# Patient Record
Sex: Female | Born: 1950 | Race: Black or African American | Hispanic: No | Marital: Single
Health system: Southern US, Community
[De-identification: ages and names within clinical notes are randomized; demographics above are authoritative.]

## PROBLEM LIST (undated history)

## (undated) DIAGNOSIS — I6312 Cerebral infarction due to embolism of basilar artery: Secondary | ICD-10-CM

## (undated) DIAGNOSIS — I7101 Dissection of thoracic aorta: Secondary | ICD-10-CM

## (undated) DIAGNOSIS — F1910 Other psychoactive substance abuse, uncomplicated: Secondary | ICD-10-CM

## (undated) DIAGNOSIS — I71019 Dissection of thoracic aorta, unspecified: Secondary | ICD-10-CM

## (undated) DIAGNOSIS — J9621 Acute and chronic respiratory failure with hypoxia: Secondary | ICD-10-CM

---

## 2020-09-19 ENCOUNTER — Other Ambulatory Visit (HOSPITAL_COMMUNITY): Payer: Medicare Other

## 2020-09-19 ENCOUNTER — Inpatient Hospital Stay
Admission: RE | Admit: 2020-09-19 | Discharge: 2020-10-16 | Disposition: A | Discharge status: Skilled Nursing Facility | Payer: Medicare Other | Attending: Internal Medicine | Admitting: Internal Medicine

## 2020-09-19 DIAGNOSIS — I6312 Cerebral infarction due to embolism of basilar artery: Secondary | ICD-10-CM | POA: Diagnosis present

## 2020-09-19 DIAGNOSIS — I7101 Dissection of thoracic aorta: Secondary | ICD-10-CM | POA: Diagnosis present

## 2020-09-19 DIAGNOSIS — Z431 Encounter for attention to gastrostomy: Secondary | ICD-10-CM

## 2020-09-19 DIAGNOSIS — F1910 Other psychoactive substance abuse, uncomplicated: Secondary | ICD-10-CM | POA: Diagnosis present

## 2020-09-19 DIAGNOSIS — Z4659 Encounter for fitting and adjustment of other gastrointestinal appliance and device: Secondary | ICD-10-CM

## 2020-09-19 DIAGNOSIS — R1310 Dysphagia, unspecified: Secondary | ICD-10-CM

## 2020-09-19 DIAGNOSIS — J9690 Respiratory failure, unspecified, unspecified whether with hypoxia or hypercapnia: Secondary | ICD-10-CM

## 2020-09-19 DIAGNOSIS — I71019 Dissection of thoracic aorta, unspecified: Secondary | ICD-10-CM | POA: Diagnosis present

## 2020-09-19 DIAGNOSIS — J9621 Acute and chronic respiratory failure with hypoxia: Secondary | ICD-10-CM | POA: Diagnosis present

## 2020-09-19 HISTORY — DX: Cerebral infarction due to embolism of basilar artery: I63.12

## 2020-09-19 HISTORY — DX: Dissection of thoracic aorta, unspecified: I71.019

## 2020-09-19 HISTORY — DX: Other psychoactive substance abuse, uncomplicated: F19.10

## 2020-09-19 HISTORY — DX: Dissection of thoracic aorta: I71.01

## 2020-09-19 HISTORY — DX: Acute and chronic respiratory failure with hypoxia: J96.21

## 2020-09-19 MED ORDER — IOHEXOL 350 MG/ML SOLN
50.0000 mL | Freq: Once | INTRAVENOUS | Status: AC | PRN
  Administered 2020-09-19: 50 mL

## 2020-09-20 ENCOUNTER — Other Ambulatory Visit (HOSPITAL_COMMUNITY): Payer: Medicare Other

## 2020-09-20 LAB — COMPREHENSIVE METABOLIC PANEL
ALT: 46 U/L — ABNORMAL HIGH (ref 0–44)
AST: 39 U/L (ref 15–41)
Albumin: 1.9 g/dL — ABNORMAL LOW (ref 3.5–5.0)
Alkaline Phosphatase: 58 U/L (ref 38–126)
Anion gap: 6 (ref 5–15)
BUN: 60 mg/dL — ABNORMAL HIGH (ref 8–23)
CO2: 27 mmol/L (ref 22–32)
Calcium: 8.8 mg/dL — ABNORMAL LOW (ref 8.9–10.3)
Chloride: 112 mmol/L — ABNORMAL HIGH (ref 98–111)
Creatinine, Ser: 2.03 mg/dL — ABNORMAL HIGH (ref 0.44–1.00)
GFR, Estimated: 26 mL/min — ABNORMAL LOW (ref >60)
Glucose, Bld: 126 mg/dL — ABNORMAL HIGH (ref 70–99)
Potassium: 4.9 mmol/L (ref 3.5–5.1)
Sodium: 145 mmol/L (ref 135–145)
Total Bilirubin: 0.3 mg/dL (ref 0.3–1.2)
Total Protein: 5.9 g/dL — ABNORMAL LOW (ref 6.5–8.1)

## 2020-09-20 LAB — CBC WITH DIFFERENTIAL/PLATELET
Abs Immature Granulocytes: 0.07 10*3/uL (ref 0.00–0.07)
Basophils Absolute: 0 10*3/uL (ref 0.0–0.1)
Basophils Relative: 0 %
Eosinophils Absolute: 0.3 10*3/uL (ref 0.0–0.5)
Eosinophils Relative: 2 %
HCT: 29.1 % — ABNORMAL LOW (ref 36.0–46.0)
Hemoglobin: 9.6 g/dL — ABNORMAL LOW (ref 12.0–15.0)
Immature Granulocytes: 1 %
Lymphocytes Relative: 9 %
Lymphs Abs: 1 10*3/uL (ref 0.7–4.0)
MCH: 30.2 pg (ref 26.0–34.0)
MCHC: 33 g/dL (ref 30.0–36.0)
MCV: 91.5 fL (ref 80.0–100.0)
Monocytes Absolute: 1.1 10*3/uL — ABNORMAL HIGH (ref 0.1–1.0)
Monocytes Relative: 9 %
Neutro Abs: 9.5 10*3/uL — ABNORMAL HIGH (ref 1.7–7.7)
Neutrophils Relative %: 79 %
Platelets: 375 10*3/uL (ref 150–400)
RBC: 3.18 MIL/uL — ABNORMAL LOW (ref 3.87–5.11)
RDW: 14.3 % (ref 11.5–15.5)
WBC: 12 10*3/uL — ABNORMAL HIGH (ref 4.0–10.5)
nRBC: 0 % (ref 0.0–0.2)

## 2020-09-20 LAB — URINALYSIS, ROUTINE W REFLEX MICROSCOPIC
Bilirubin Urine: NEGATIVE
Glucose, UA: NEGATIVE mg/dL
Hgb urine dipstick: NEGATIVE
Ketones, ur: NEGATIVE mg/dL
Nitrite: NEGATIVE
Protein, ur: 30 mg/dL — AB
Specific Gravity, Urine: 1.012 (ref 1.005–1.030)
pH: 7 (ref 5.0–8.0)

## 2020-09-20 LAB — MAGNESIUM: Magnesium: 2.5 mg/dL — ABNORMAL HIGH (ref 1.7–2.4)

## 2020-09-20 LAB — BLOOD GAS, ARTERIAL
Acid-Base Excess: 1.7 mmol/L (ref 0.0–2.0)
Bicarbonate: 25 mmol/L (ref 20.0–28.0)
FIO2: 28
O2 Saturation: 94.9 %
Patient temperature: 36.3
pCO2 arterial: 32.9 mmHg (ref 32.0–48.0)
pH, Arterial: 7.489 — ABNORMAL HIGH (ref 7.350–7.450)
pO2, Arterial: 71 mmHg — ABNORMAL LOW (ref 83.0–108.0)

## 2020-09-20 LAB — PHOSPHORUS: Phosphorus: 4.9 mg/dL — ABNORMAL HIGH (ref 2.5–4.6)

## 2020-09-20 LAB — C DIFFICILE QUICK SCREEN W PCR REFLEX
C Diff antigen: NEGATIVE
C Diff interpretation: NOT DETECTED
C Diff toxin: NEGATIVE

## 2020-09-20 LAB — PROTIME-INR
INR: 1.3 — ABNORMAL HIGH (ref 0.8–1.2)
Prothrombin Time: 15.4 seconds — ABNORMAL HIGH (ref 11.4–15.2)

## 2020-09-21 LAB — URINALYSIS, ROUTINE W REFLEX MICROSCOPIC
Bilirubin Urine: NEGATIVE
Glucose, UA: NEGATIVE mg/dL
Hgb urine dipstick: NEGATIVE
Ketones, ur: NEGATIVE mg/dL
Nitrite: NEGATIVE
Protein, ur: 30 mg/dL — AB
Specific Gravity, Urine: 1.012 (ref 1.005–1.030)
pH: 7 (ref 5.0–8.0)

## 2020-09-22 LAB — URINE CULTURE
Culture: NO GROWTH
Culture: NO GROWTH

## 2020-09-23 LAB — BASIC METABOLIC PANEL
Anion gap: 6 (ref 5–15)
BUN: 55 mg/dL — ABNORMAL HIGH (ref 8–23)
CO2: 25 mmol/L (ref 22–32)
Calcium: 8.7 mg/dL — ABNORMAL LOW (ref 8.9–10.3)
Chloride: 112 mmol/L — ABNORMAL HIGH (ref 98–111)
Creatinine, Ser: 2.18 mg/dL — ABNORMAL HIGH (ref 0.44–1.00)
GFR, Estimated: 24 mL/min — ABNORMAL LOW (ref >60)
Glucose, Bld: 145 mg/dL — ABNORMAL HIGH (ref 70–99)
Potassium: 4.6 mmol/L (ref 3.5–5.1)
Sodium: 143 mmol/L (ref 135–145)

## 2020-09-23 LAB — CBC
HCT: 26 % — ABNORMAL LOW (ref 36.0–46.0)
Hemoglobin: 8.3 g/dL — ABNORMAL LOW (ref 12.0–15.0)
MCH: 29.3 pg (ref 26.0–34.0)
MCHC: 31.9 g/dL (ref 30.0–36.0)
MCV: 91.9 fL (ref 80.0–100.0)
Platelets: 379 10*3/uL (ref 150–400)
RBC: 2.83 MIL/uL — ABNORMAL LOW (ref 3.87–5.11)
RDW: 14.4 % (ref 11.5–15.5)
WBC: 12.2 10*3/uL — ABNORMAL HIGH (ref 4.0–10.5)
nRBC: 0 % (ref 0.0–0.2)

## 2020-09-23 LAB — CULTURE, RESPIRATORY W GRAM STAIN: Culture: NORMAL

## 2020-09-23 LAB — MAGNESIUM: Magnesium: 2.3 mg/dL (ref 1.7–2.4)

## 2020-09-24 DIAGNOSIS — J9621 Acute and chronic respiratory failure with hypoxia: Secondary | ICD-10-CM | POA: Diagnosis not present

## 2020-09-24 DIAGNOSIS — I6312 Cerebral infarction due to embolism of basilar artery: Secondary | ICD-10-CM

## 2020-09-24 DIAGNOSIS — F1910 Other psychoactive substance abuse, uncomplicated: Secondary | ICD-10-CM | POA: Diagnosis not present

## 2020-09-24 DIAGNOSIS — I7101 Dissection of thoracic aorta: Secondary | ICD-10-CM | POA: Diagnosis not present

## 2020-09-24 NOTE — Consult Note (Signed)
Pulmonary Critical Care Medicine [facility] [place]  PULMONARY SERVICE  Date of Service: 09/24/2020  PULMONARY CRITICAL CARE CONSULT   [name]  HKV:[id number]  DOB: 1950/07/24   DOA: 09/19/2020  Referring Physician: Carron Curie, MD  HPI: Misty Lawson is a 70 y.o. female seen for follow up of Acute on Chronic Respiratory Failure.  Patient has multiple medical problems basically presented with a history of glaucoma hypertension and cocaine abuse presented to the hospital because of chest pain.  Patient was found to have a type a dissection at the level of the root taken to the OR for emergent surgery and replacement of the descending aortic hemiarch.  The patient had transferred to the ICU postoperatively EF at that time was noted to be 40 to 45%.  The patient subsequently had an echocardiogram which showed an improvement to EF of 60 to 65%.  She is now transferred to our facility for further management and treatment.  At the time of evaluation patient was basically off the ventilator.  Review of Systems:  ROS performed and is unremarkable other than noted above.  Past Medical History:  Diagnosis Date  . Food impaction of esophagus 12/23/2012  . Glaucoma 12/23/2012  . Hypertension 12/23/2012   Past Surgical History:  Procedure Laterality Date  . ESOPHAGOSCOPY N/A 12/23/2012  Procedure: ESOPHAGOSCOPY RIGID; Surgeon: Barnie Alderman, MD; Location: [facility] OUTPATIENT OR; Service: ENT; Laterality: N/A; removal food impaction   No family history on file.  Social History  Positive for substance abuse including cocaine   Medications: Reviewed on Rounds  Physical Exam:  Vitals: Temperature 97.2 pulse 60 respiratory rate is 22 blood pressure is 123/64 saturations 97%  Ventilator Settings on pressure support FiO2 28% pressure 12/5  . General: Comfortable at this time . Eyes: Grossly normal lids, irises & conjunctiva . ENT: grossly tongue is normal . Neck: no obvious  mass . Cardiovascular: S1-S2 normal no gallop or rub . Respiratory: Scattered rhonchi expansion is equal . Abdomen: Soft and nontender . Skin: no rash seen on limited exam . Musculoskeletal: not rigid . Psychiatric:unable to assess . Neurologic: no seizure no involuntary movements         Labs on Admission:  Basic Metabolic Panel: Recent Labs  Lab 09/20/20 0535 09/23/20 0227  NA 145 143  K 4.9 4.6  CL 112* 112*  CO2 27 25  GLUCOSE 126* 145*  BUN 60* 55*  CREATININE 2.03* 2.18*  CALCIUM 8.8* 8.7*  MG 2.5* 2.3  PHOS 4.9*  --     Recent Labs  Lab 09/20/20 1500  PHART 7.489*  PCO2ART 32.9  PO2ART 71.0*  HCO3 25.0  O2SAT 94.9    Liver Function Tests: Recent Labs  Lab 09/20/20 0535  AST 39  ALT 46*  ALKPHOS 58  BILITOT 0.3  PROT 5.9*  ALBUMIN 1.9*   No results for input(s): LIPASE, AMYLASE in the last 168 hours. No results for input(s): AMMONIA in the last 168 hours.  CBC: Recent Labs  Lab 09/20/20 0535 09/23/20 0227  WBC 12.0* 12.2*  NEUTROABS 9.5*  --   HGB 9.6* 8.3*  HCT 29.1* 26.0*  MCV 91.5 91.9  PLT 375 379    Cardiac Enzymes: No results for input(s): CKTOTAL, CKMB, CKMBINDEX, TROPONINI in the last 168 hours.  BNP (last 3 results) No results for input(s): BNP in the last 8760 hours.  ProBNP (last 3 results) No results for input(s): PROBNP in the last 8760 hours.   Radiological Exams  on Admission: No results found.  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 Acute on chronic respiratory failure hypoxia we will continue with pressure support right now is on 28% FiO2 patient will be attempting to wean on T collar 16 hours as tolerated also today 2. Polysubstance abuse needs to be monitored for ongoing withdrawal issues. 3. Acute stroke patient had MRI which  revealed acute stroke we will continue to monitor closely. 4. Type a aortic dissection status post repair  I have personally seen and evaluated the patient, evaluated laboratory and imaging results, formulated the assessment and plan and placed orders. The Patient requires high complexity decision making with multiple systems involvement.  Case was discussed on Rounds with the Respiratory Therapy Director and the Respiratory staff Time Spent  Yevonne Pax, MD [facility] Pulmonary Critical Care Medicine Sleep Medicine

## 2020-09-25 ENCOUNTER — Encounter: Payer: Self-pay | Admitting: Internal Medicine

## 2020-09-25 DIAGNOSIS — I6312 Cerebral infarction due to embolism of basilar artery: Secondary | ICD-10-CM | POA: Diagnosis present

## 2020-09-25 DIAGNOSIS — F1910 Other psychoactive substance abuse, uncomplicated: Secondary | ICD-10-CM | POA: Diagnosis not present

## 2020-09-25 DIAGNOSIS — J9621 Acute and chronic respiratory failure with hypoxia: Secondary | ICD-10-CM | POA: Diagnosis present

## 2020-09-25 DIAGNOSIS — I7101 Dissection of thoracic aorta: Secondary | ICD-10-CM | POA: Diagnosis present

## 2020-09-25 DIAGNOSIS — I71019 Dissection of thoracic aorta, unspecified: Secondary | ICD-10-CM | POA: Diagnosis present

## 2020-09-25 LAB — PHOSPHORUS: Phosphorus: 5.2 mg/dL — ABNORMAL HIGH (ref 2.5–4.6)

## 2020-09-25 LAB — BASIC METABOLIC PANEL
Anion gap: 7 (ref 5–15)
BUN: 51 mg/dL — ABNORMAL HIGH (ref 8–23)
CO2: 26 mmol/L (ref 22–32)
Calcium: 9 mg/dL (ref 8.9–10.3)
Chloride: 107 mmol/L (ref 98–111)
Creatinine, Ser: 1.93 mg/dL — ABNORMAL HIGH (ref 0.44–1.00)
GFR, Estimated: 28 mL/min — ABNORMAL LOW (ref >60)
Glucose, Bld: 136 mg/dL — ABNORMAL HIGH (ref 70–99)
Potassium: 5 mmol/L (ref 3.5–5.1)
Sodium: 140 mmol/L (ref 135–145)

## 2020-09-25 LAB — CBC
HCT: 26.1 % — ABNORMAL LOW (ref 36.0–46.0)
Hemoglobin: 8.3 g/dL — ABNORMAL LOW (ref 12.0–15.0)
MCH: 29.5 pg (ref 26.0–34.0)
MCHC: 31.8 g/dL (ref 30.0–36.0)
MCV: 92.9 fL (ref 80.0–100.0)
Platelets: 423 10*3/uL — ABNORMAL HIGH (ref 150–400)
RBC: 2.81 MIL/uL — ABNORMAL LOW (ref 3.87–5.11)
RDW: 14.3 % (ref 11.5–15.5)
WBC: 12 10*3/uL — ABNORMAL HIGH (ref 4.0–10.5)
nRBC: 0 % (ref 0.0–0.2)

## 2020-09-25 LAB — MAGNESIUM: Magnesium: 2.1 mg/dL (ref 1.7–2.4)

## 2020-09-25 NOTE — Progress Notes (Signed)
Pulmonary Critical Care Medicine [facility] [place]   PULMONARY CRITICAL CARE SERVICE  PROGRESS NOTE     Misty Lawson  PJA:[id number]  DOB: 1951-01-23   DOA: 09/19/2020  Referring Physician: Carron Curie, MD  HPI: Misty Lawson is a 70 y.o. female seen for follow up of Acute on Chronic Respiratory Failure.  At this time patient is on T collar has been on 20% FiO2 the goal is for 20 hours  Medications: Reviewed on Rounds  Physical Exam:  Vitals: Temperature is 97.3 pulse 68 respiratory rate 30 blood pressure is 149/87 saturations 97%  Ventilator Settings on T collar with an FiO2 of 28%  . General: Comfortable at this time . Eyes: Grossly normal lids, irises & conjunctiva . ENT: grossly tongue is normal . Neck: no obvious mass . Cardiovascular: S1 S2 normal no gallop . Respiratory: Scattered rhonchi expansion is equal . Abdomen: soft . Skin: no rash seen on limited exam . Musculoskeletal: not rigid . Psychiatric:unable to assess . Neurologic: no seizure no involuntary movements         Lab Data:   Basic Metabolic Panel: Recent Labs  Lab 09/20/20 0535 09/23/20 0227 09/25/20 0551  NA 145 143 140  K 4.9 4.6 5.0  CL 112* 112* 107  CO2 27 25 26   GLUCOSE 126* 145* 136*  BUN 60* 55* 51*  CREATININE 2.03* 2.18* 1.93*  CALCIUM 8.8* 8.7* 9.0  MG 2.5* 2.3 2.1  PHOS 4.9*  --  5.2*    ABG: Recent Labs  Lab 09/20/20 1500  PHART 7.489*  PCO2ART 32.9  PO2ART 71.0*  HCO3 25.0  O2SAT 94.9    Liver Function Tests: Recent Labs  Lab 09/20/20 0535  AST 39  ALT 46*  ALKPHOS 58  BILITOT 0.3  PROT 5.9*  ALBUMIN 1.9*   No results for input(s): LIPASE, AMYLASE in the last 168 hours. No results for input(s): AMMONIA in the last 168 hours.  CBC: Recent Labs  Lab 09/20/20 0535 09/23/20 0227 09/25/20 0551  WBC 12.0* 12.2* 12.0*  NEUTROABS 9.5*  --   --   HGB 9.6* 8.3* 8.3*  HCT 29.1* 26.0* 26.1*  MCV 91.5 91.9 92.9  PLT 375 379 423*    Cardiac  Enzymes: No results for input(s): CKTOTAL, CKMB, CKMBINDEX, TROPONINI in the last 168 hours.  BNP (last 3 results) No results for input(s): BNP in the last 8760 hours.  ProBNP (last 3 results) No results for input(s): PROBNP in the last 8760 hours.  Radiological Exams: No results found.  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 Acute on chronic respiratory failure hypoxia we will continue with T collar trials patient currently is on 20% FiO2 goal is for 20 hours 2. Polysubstance abuse no sign of active withdrawal but we are going to continue to monitor closely. 3. Acute stroke supportive care therapy as tolerated 4. Thoracic aortic aneurysm dissection status post repair   I have personally seen and evaluated the patient, evaluated laboratory and imaging results, formulated the assessment and plan and placed orders. The Patient requires high complexity decision making with multiple systems involvement.  Rounds were done with the Respiratory Therapy Director and Staff therapists and discussed with nursing staff also.  11/25/20, MD [facility] Pulmonary Critical Care Medicine Sleep Medicine

## 2020-09-26 DIAGNOSIS — I6312 Cerebral infarction due to embolism of basilar artery: Secondary | ICD-10-CM | POA: Diagnosis not present

## 2020-09-26 DIAGNOSIS — J9621 Acute and chronic respiratory failure with hypoxia: Secondary | ICD-10-CM | POA: Diagnosis not present

## 2020-09-26 DIAGNOSIS — I7101 Dissection of thoracic aorta: Secondary | ICD-10-CM | POA: Diagnosis not present

## 2020-09-26 DIAGNOSIS — F1910 Other psychoactive substance abuse, uncomplicated: Secondary | ICD-10-CM | POA: Diagnosis not present

## 2020-09-26 LAB — MAGNESIUM: Magnesium: 2 mg/dL (ref 1.7–2.4)

## 2020-09-26 LAB — PHOSPHORUS: Phosphorus: 4.7 mg/dL — ABNORMAL HIGH (ref 2.5–4.6)

## 2020-09-26 LAB — BASIC METABOLIC PANEL
Anion gap: 7 (ref 5–15)
BUN: 66 mg/dL — ABNORMAL HIGH (ref 8–23)
CO2: 24 mmol/L (ref 22–32)
Calcium: 8.6 mg/dL — ABNORMAL LOW (ref 8.9–10.3)
Chloride: 108 mmol/L (ref 98–111)
Creatinine, Ser: 1.89 mg/dL — ABNORMAL HIGH (ref 0.44–1.00)
GFR, Estimated: 29 mL/min — ABNORMAL LOW (ref >60)
Glucose, Bld: 131 mg/dL — ABNORMAL HIGH (ref 70–99)
Potassium: 5.2 mmol/L — ABNORMAL HIGH (ref 3.5–5.1)
Sodium: 139 mmol/L (ref 135–145)

## 2020-09-26 NOTE — Progress Notes (Signed)
Pulmonary Critical Care Medicine [facility] [place]   PULMONARY CRITICAL CARE SERVICE  PROGRESS NOTE     Misty Lawson  BSW:[id number]  DOB: March 29, 1951   DOA: 09/19/2020  Referring Physician: Carron Curie, MD  HPI: Misty Lawson is a 70 y.o. female seen for follow up of Acute on Chronic Respiratory Failure.  Patient remains resting comfortably was able to do 16 hours of T collar today should be able to do about 20  Medications: Reviewed on Rounds  Physical Exam:  Vitals: Temperature is 97.8 pulse 74 respiratory rate is 20 blood pressure is 157/102 saturations 100%  Ventilator Settings on T collar  . General: Comfortable at this time . Eyes: Grossly normal lids, irises & conjunctiva . ENT: grossly tongue is normal . Neck: no obvious mass . Cardiovascular: S1 S2 normal no gallop . Respiratory: No rhonchi no rales are noted at this time . Abdomen: soft . Skin: no rash seen on limited exam . Musculoskeletal: not rigid . Psychiatric:unable to assess . Neurologic: no seizure no involuntary movements         Lab Data:   Basic Metabolic Panel: Recent Labs  Lab 09/20/20 0535 09/23/20 0227 09/25/20 0551 09/26/20 0700  NA 145 143 140 139  K 4.9 4.6 5.0 5.2*  CL 112* 112* 107 108  CO2 27 25 26 24   GLUCOSE 126* 145* 136* 131*  BUN 60* 55* 51* 66*  CREATININE 2.03* 2.18* 1.93* 1.89*  CALCIUM 8.8* 8.7* 9.0 8.6*  MG 2.5* 2.3 2.1 2.0  PHOS 4.9*  --  5.2* 4.7*    ABG: Recent Labs  Lab 09/20/20 1500  PHART 7.489*  PCO2ART 32.9  PO2ART 71.0*  HCO3 25.0  O2SAT 94.9    Liver Function Tests: Recent Labs  Lab 09/20/20 0535  AST 39  ALT 46*  ALKPHOS 58  BILITOT 0.3  PROT 5.9*  ALBUMIN 1.9*   No results for input(s): LIPASE, AMYLASE in the last 168 hours. No results for input(s): AMMONIA in the last 168 hours.  CBC: Recent Labs  Lab 09/20/20 0535 09/23/20 0227 09/25/20 0551  WBC 12.0* 12.2* 12.0*  NEUTROABS 9.5*  --   --   HGB 9.6* 8.3* 8.3*   HCT 29.1* 26.0* 26.1*  MCV 91.5 91.9 92.9  PLT 375 379 423*    Cardiac Enzymes: No results for input(s): CKTOTAL, CKMB, CKMBINDEX, TROPONINI in the last 168 hours.  BNP (last 3 results) No results for input(s): BNP in the last 8760 hours.  ProBNP (last 3 results) No results for input(s): PROBNP in the last 8760 hours.  Radiological Exams: No results found.  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 Acute on chronic respiratory failure with hypoxia plan is to continue with the weekly T collar 20 hours 2. Polysubstance abuse no change we will continue to follow 3. Acute stroke no change continue present therapy 4. Acute thoracic get aortic aneurysm supportive care no active bleeding   I have personally seen and evaluated the patient, evaluated laboratory and imaging results, formulated the assessment and plan and placed orders. The Patient requires high complexity decision making with multiple systems involvement.  Rounds were done with the Respiratory Therapy Director and Staff therapists and discussed with nursing staff also.  11/25/20, MD [facility] Pulmonary Critical Care Medicine Sleep  Medicine

## 2020-09-27 DIAGNOSIS — I7101 Dissection of thoracic aorta: Secondary | ICD-10-CM | POA: Diagnosis not present

## 2020-09-27 DIAGNOSIS — J9621 Acute and chronic respiratory failure with hypoxia: Secondary | ICD-10-CM | POA: Diagnosis not present

## 2020-09-27 DIAGNOSIS — I6312 Cerebral infarction due to embolism of basilar artery: Secondary | ICD-10-CM | POA: Diagnosis not present

## 2020-09-27 DIAGNOSIS — F1910 Other psychoactive substance abuse, uncomplicated: Secondary | ICD-10-CM | POA: Diagnosis not present

## 2020-09-27 LAB — RENAL FUNCTION PANEL
Albumin: 1.9 g/dL — ABNORMAL LOW (ref 3.5–5.0)
Anion gap: 8 (ref 5–15)
BUN: 55 mg/dL — ABNORMAL HIGH (ref 8–23)
CO2: 25 mmol/L (ref 22–32)
Calcium: 8.9 mg/dL (ref 8.9–10.3)
Chloride: 108 mmol/L (ref 98–111)
Creatinine, Ser: 1.93 mg/dL — ABNORMAL HIGH (ref 0.44–1.00)
GFR, Estimated: 28 mL/min — ABNORMAL LOW (ref >60)
Glucose, Bld: 108 mg/dL — ABNORMAL HIGH (ref 70–99)
Phosphorus: 5.4 mg/dL — ABNORMAL HIGH (ref 2.5–4.6)
Potassium: 4.3 mmol/L (ref 3.5–5.1)
Sodium: 141 mmol/L (ref 135–145)

## 2020-09-27 LAB — CBC
HCT: 25.2 % — ABNORMAL LOW (ref 36.0–46.0)
Hemoglobin: 8.4 g/dL — ABNORMAL LOW (ref 12.0–15.0)
MCH: 30 pg (ref 26.0–34.0)
MCHC: 33.3 g/dL (ref 30.0–36.0)
MCV: 90 fL (ref 80.0–100.0)
Platelets: 429 10*3/uL — ABNORMAL HIGH (ref 150–400)
RBC: 2.8 MIL/uL — ABNORMAL LOW (ref 3.87–5.11)
RDW: 14.2 % (ref 11.5–15.5)
WBC: 9 10*3/uL (ref 4.0–10.5)
nRBC: 0 % (ref 0.0–0.2)

## 2020-09-27 LAB — BLOOD GAS, ARTERIAL
Acid-Base Excess: 1.4 mmol/L (ref 0.0–2.0)
Bicarbonate: 25.1 mmol/L (ref 20.0–28.0)
FIO2: 28
O2 Saturation: 94 %
Patient temperature: 36.7
pCO2 arterial: 37.1 mmHg (ref 32.0–48.0)
pH, Arterial: 7.444 (ref 7.350–7.450)
pO2, Arterial: 70.9 mmHg — ABNORMAL LOW (ref 83.0–108.0)

## 2020-09-27 LAB — MAGNESIUM: Magnesium: 2.2 mg/dL (ref 1.7–2.4)

## 2020-09-27 NOTE — Progress Notes (Signed)
Pulmonary Critical Care Medicine [facility] [place]   PULMONARY CRITICAL CARE SERVICE  PROGRESS NOTE     Misty Lawson  CHY:[id number]  DOB: 11-02-1950   DOA: 09/19/2020  Referring Physician: Carron Curie, MD  HPI: Misty Lawson is a 70 y.o. female seen for follow up of Acute on Chronic Respiratory Failure.  Patient currently is on T collar comfortable without distress has been on 28% FiO2  Medications: Reviewed on Rounds  Physical Exam:  Vitals: Temperature is 98.1 pulse 81 respiratory 24 blood pressure is 146/86 saturations 97%  Ventilator Settings: T collar with FiO2 28%  . General: Comfortable at this time . Eyes: Grossly normal lids, irises & conjunctiva . ENT: grossly tongue is normal . Neck: no obvious mass . Cardiovascular: S1 S2 normal no gallop . Respiratory: No rhonchi or rales are noted at this time . Abdomen: soft . Skin: no rash seen on limited exam . Musculoskeletal: not rigid . Psychiatric:unable to assess . Neurologic: no seizure no involuntary movements         Lab Data:   Basic Metabolic Panel: Recent Labs  Lab 09/23/20 0227 09/25/20 0551 09/26/20 0700 09/27/20 0441  NA 143 140 139 141  K 4.6 5.0 5.2* 4.3  CL 112* 107 108 108  CO2 25 26 24 25   GLUCOSE 145* 136* 131* 108*  BUN 55* 51* 66* 55*  CREATININE 2.18* 1.93* 1.89* 1.93*  CALCIUM 8.7* 9.0 8.6* 8.9  MG 2.3 2.1 2.0 2.2  PHOS  --  5.2* 4.7* 5.4*    ABG: Recent Labs  Lab 09/20/20 1500 09/27/20 1215  PHART 7.489* 7.444  PCO2ART 32.9 37.1  PO2ART 71.0* 70.9*  HCO3 25.0 25.1  O2SAT 94.9 94.0    Liver Function Tests: Recent Labs  Lab 09/27/20 0441  ALBUMIN 1.9*   No results for input(s): LIPASE, AMYLASE in the last 168 hours. No results for input(s): AMMONIA in the last 168 hours.  CBC: Recent Labs  Lab 09/23/20 0227 09/25/20 0551 09/27/20 0441  WBC 12.2* 12.0* 9.0  HGB 8.3* 8.3* 8.4*  HCT 26.0* 26.1* 25.2*  MCV 91.9 92.9 90.0  PLT 379 423* 429*     Cardiac Enzymes: No results for input(s): CKTOTAL, CKMB, CKMBINDEX, TROPONINI in the last 168 hours.  BNP (last 3 results) No results for input(s): BNP in the last 8760 hours.  ProBNP (last 3 results) No results for input(s): PROBNP in the last 8760 hours.  Radiological Exams: No results found.  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 Acute on chronic respiratory failure hypoxia plan is to continue T collar titrate oxygen continue pulmonary toilet. 2. Polysubstance abuse no change we will continue to follow along. 3. Acute stroke at baseline 4. Thoracic aortic dissection supportive care   I have personally seen and evaluated the patient, evaluated laboratory and imaging results, formulated the assessment and plan and placed orders. The Patient requires high complexity decision making with multiple systems involvement.  Rounds were done with the Respiratory Therapy Director and Staff therapists and discussed with nursing staff also.  09/29/20, MD [facility] Pulmonary Critical Care Medicine Sleep Medicine

## 2020-09-28 ENCOUNTER — Other Ambulatory Visit (HOSPITAL_COMMUNITY): Payer: Medicare Other

## 2020-09-28 DIAGNOSIS — I7101 Dissection of thoracic aorta: Secondary | ICD-10-CM | POA: Diagnosis not present

## 2020-09-28 DIAGNOSIS — I6312 Cerebral infarction due to embolism of basilar artery: Secondary | ICD-10-CM | POA: Diagnosis not present

## 2020-09-28 DIAGNOSIS — F1910 Other psychoactive substance abuse, uncomplicated: Secondary | ICD-10-CM | POA: Diagnosis not present

## 2020-09-28 DIAGNOSIS — J9621 Acute and chronic respiratory failure with hypoxia: Secondary | ICD-10-CM | POA: Diagnosis not present

## 2020-09-28 LAB — CULTURE, RESPIRATORY W GRAM STAIN: Culture: NORMAL

## 2020-09-28 NOTE — Progress Notes (Signed)
Pulmonary Critical Care Medicine [facility] [place]   PULMONARY CRITICAL CARE SERVICE  PROGRESS NOTE     Misty Lawson  VQM:[id number]  DOB: [birth year]   DOA: 09/19/2020  Referring Physician: Carron Curie, MD  HPI: Misty Lawson is a 70 y.o. female seen for follow up of Acute on Chronic Respiratory Failure.  Patient remains on T collar has been on 35% FiO2 with goal of 48 hours  Medications: Reviewed on Rounds  Physical Exam:  Vitals: Temperature is 97.8 pulse 75 respiratory 29 blood pressure is 138/86 saturations 99%  Ventilator Settings on T collar with an FiO2 of 35%  . General: Comfortable at this time . Eyes: Grossly normal lids, irises & conjunctiva . ENT: grossly tongue is normal . Neck: no obvious mass . Cardiovascular: S1 S2 normal no gallop . Respiratory: No rhonchi very coarse breath sounds . Abdomen: soft . Skin: no rash seen on limited exam . Musculoskeletal: not rigid . Psychiatric:unable to assess . Neurologic: no seizure no involuntary movements         Lab Data:   Basic Metabolic Panel: Recent Labs  Lab 09/23/20 0227 09/25/20 0551 09/26/20 0700 09/27/20 0441  NA 143 140 139 141  K 4.6 5.0 5.2* 4.3  CL 112* 107 108 108  CO2 25 26 24 25   GLUCOSE 145* 136* 131* 108*  BUN 55* 51* 66* 55*  CREATININE 2.18* 1.93* 1.89* 1.93*  CALCIUM 8.7* 9.0 8.6* 8.9  MG 2.3 2.1 2.0 2.2  PHOS  --  5.2* 4.7* 5.4*    ABG: Recent Labs  Lab 09/27/20 1215  PHART 7.444  PCO2ART 37.1  PO2ART 70.9*  HCO3 25.1  O2SAT 94.0    Liver Function Tests: Recent Labs  Lab 09/27/20 0441  ALBUMIN 1.9*   No results for input(s): LIPASE, AMYLASE in the last 168 hours. No results for input(s): AMMONIA in the last 168 hours.  CBC: Recent Labs  Lab 09/23/20 0227 09/25/20 0551 09/27/20 0441  WBC 12.2* 12.0* 9.0  HGB 8.3* 8.3* 8.4*  HCT 26.0* 26.1* 25.2*  MCV 91.9 92.9 90.0  PLT 379 423* 429*    Cardiac Enzymes: No results for input(s): CKTOTAL,  CKMB, CKMBINDEX, TROPONINI in the last 168 hours.  BNP (last 3 results) No results for input(s): BNP in the last 8760 hours.  ProBNP (last 3 results) No results for input(s): PROBNP in the last 8760 hours.  Radiological Exams: No results found.  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 Acute on chronic respiratory failure hypoxia continue OT collar trials titrate oxygen as tolerated continue pulmonary toilet. 2. Acute stroke no change continue supportive care 3. Thoracic aortic aneurysm supportive care 4. Polysubstance abuse no active withdrawal right now   I have personally seen and evaluated the patient, evaluated laboratory and imaging results, formulated the assessment and plan and placed orders. The Patient requires high complexity decision making with multiple systems involvement.  Rounds were done with the Respiratory Therapy Director and Staff therapists and discussed with nursing staff also.  09/29/20, MD [facility] Pulmonary Critical Care Medicine Sleep Medicine

## 2020-09-29 LAB — RENAL FUNCTION PANEL
Albumin: 1.9 g/dL — ABNORMAL LOW (ref 3.5–5.0)
Anion gap: 10 (ref 5–15)
BUN: 61 mg/dL — ABNORMAL HIGH (ref 8–23)
CO2: 25 mmol/L (ref 22–32)
Calcium: 9.2 mg/dL (ref 8.9–10.3)
Chloride: 107 mmol/L (ref 98–111)
Creatinine, Ser: 1.69 mg/dL — ABNORMAL HIGH (ref 0.44–1.00)
GFR, Estimated: 33 mL/min — ABNORMAL LOW (ref >60)
Glucose, Bld: 106 mg/dL — ABNORMAL HIGH (ref 70–99)
Phosphorus: 5.1 mg/dL — ABNORMAL HIGH (ref 2.5–4.6)
Potassium: 4.4 mmol/L (ref 3.5–5.1)
Sodium: 142 mmol/L (ref 135–145)

## 2020-09-29 LAB — CBC
HCT: 24.4 % — ABNORMAL LOW (ref 36.0–46.0)
Hemoglobin: 7.9 g/dL — ABNORMAL LOW (ref 12.0–15.0)
MCH: 29 pg (ref 26.0–34.0)
MCHC: 32.4 g/dL (ref 30.0–36.0)
MCV: 89.7 fL (ref 80.0–100.0)
Platelets: 428 10*3/uL — ABNORMAL HIGH (ref 150–400)
RBC: 2.72 MIL/uL — ABNORMAL LOW (ref 3.87–5.11)
RDW: 14.1 % (ref 11.5–15.5)
WBC: 8.8 10*3/uL (ref 4.0–10.5)
nRBC: 0 % (ref 0.0–0.2)

## 2020-09-29 LAB — MAGNESIUM: Magnesium: 2.1 mg/dL (ref 1.7–2.4)

## 2020-09-30 ENCOUNTER — Other Ambulatory Visit (HOSPITAL_COMMUNITY): Payer: Medicare Other

## 2020-09-30 LAB — SARS CORONAVIRUS 2 (TAT 6-24 HRS): SARS Coronavirus 2: NEGATIVE

## 2020-10-01 DIAGNOSIS — J9621 Acute and chronic respiratory failure with hypoxia: Secondary | ICD-10-CM | POA: Diagnosis not present

## 2020-10-01 DIAGNOSIS — I7101 Dissection of thoracic aorta: Secondary | ICD-10-CM | POA: Diagnosis not present

## 2020-10-01 DIAGNOSIS — I6312 Cerebral infarction due to embolism of basilar artery: Secondary | ICD-10-CM | POA: Diagnosis not present

## 2020-10-01 DIAGNOSIS — F1910 Other psychoactive substance abuse, uncomplicated: Secondary | ICD-10-CM | POA: Diagnosis not present

## 2020-10-01 NOTE — Consult Note (Signed)
Chief Complaint: Patient was seen in consultation today for percutaneous gastric tube placement at the request of Dr Sharyon Medicus   Supervising Physician: Marliss Coots  Patient Status: Select IP  History of Present Illness: Misty Lawson is a 70 y.o. female   Polysubstance abuse Acute thoracic aortic dissection CVA Encephalopathy Dysphagia Protein calorie malnutrition Respiratory failure AKI/CKD  Trach placed 09/18/20  Request for percutaneous gastric tube placement Dr Bryn Gulling has reviewed imagimg and approved procedure Will need Omnipaque 75 ml po night before procedure to opacify bowel per Dr Bryn Gulling   Past Medical History:  Diagnosis Date  . Acute on chronic respiratory failure with hypoxia (HCC)   . 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 Acute thoracic aortic dissection (HCC)   . Polysubstance abuse (HCC)      Allergies: Patient has no allergy information on record.  Medications: Prior to Admission medications   Not on File     No family history on file.  Social History   Socioeconomic History  . Marital status: Single    Spouse name: Not on file  . Number of children: Not on file  . Years of education: Not on file  . Highest education level: Not on file  Occupational History  . Not on file  Tobacco Use  . Smoking status: Not on file  . Smokeless tobacco: Not on file  Substance and Sexual Activity  . Alcohol use: Not on file  . Drug use: Not on file  . Sexual activity: Not on file  Other Topics Concern  . Not on file  Social History Narrative  . Not on file   Social Determinants of Health   Financial Resource Strain: Not on file  Food Insecurity: Not on file  Transportation Needs: Not on file  Physical Activity: Not on file  Stress: Not on file  Social Connections: Not on file    Review of Systems: A 12 point ROS discussed and pertinent positives are indicated in the HPI above.  All other systems are negative.   Vital  Signs: There were no vitals taken for this visit.  Physical Exam Vitals reviewed.  HENT:     Mouth/Throat:     Mouth: Mucous membranes are moist.  Cardiovascular:     Rate and Rhythm: Normal rate and regular rhythm.     Heart sounds: Normal heart sounds.  Pulmonary:     Breath sounds: Normal breath sounds. No wheezing.     Comments: Trach in place Abdominal:     Palpations: Abdomen is soft.  Musculoskeletal:     Comments: No response   Skin:    General: Skin is warm.  Neurological:     Comments: No response  Psychiatric:     Comments: Spoke to sister Misty Lawson via phone She consents to procedure     Imaging: CT ABDOMEN PELVIS WO CONTRAST  Result Date: 09/29/2020 CLINICAL DATA:  Assess anatomy for G-tube placement EXAM: CT ABDOMEN AND PELVIS WITHOUT CONTRAST TECHNIQUE: Multidetector CT imaging of the abdomen and pelvis was performed following the standard protocol without IV contrast. COMPARISON:  None. FINDINGS: Lower chest: Moderate to large bilateral pleural effusions. Hepatobiliary: No significant abnormality of the liver, gallbladder, or bile ducts. Pancreas: Very difficult to evaluate due to lack of intraperitoneal fat and IV contrast. Grossly unremarkable. Spleen: Unremarkable Adrenals/Urinary Tract: Adrenal glands are unremarkable. No significant abnormality of the kidneys. Small focus of air within the bladder lumen likely due to recent catheterization.  Please correlate with patient history. Stomach/Bowel: Nasogastric tube terminates in the proximal stomach. The stomach is difficult to delineate due to lack of intraperitoneal fat and IV contrast. There will likely be a small window between the liver and transverse colon when the stomach is inflated. No bowel dilatation to indicate ileus or obstruction. Vascular/Lymphatic: No enlarged lymph nodes. Reproductive: No significant abnormality. Other: Diffuse body wall edema. Musculoskeletal: Defect in the midline at the xiphoid  process may be due to prior surgical intervention versus fracture. IMPRESSION: Examination is limited by lack of IV contrast and lack of intraperitoneal fat. There will likely be a window between the liver and transverse colon for placement of percutaneous gastrostomy tube. Recommend administration of oral contrast the night before the examination to definitively delineate the transverse colon. Electronically Signed   By: Acquanetta Belling M.D.   On: 09/29/2020 08:27   DG Abd 1 View  Result Date: 09/30/2020 CLINICAL DATA:  Nasogastric tube placement EXAM: ABDOMEN - 1 VIEW COMPARISON:  None. FINDINGS: Nasogastric tube appears well positioned in the stomach. No dilated large or small bowel loops are seen. No evidence of free intraperitoneal air. Lung bases appear clear. IMPRESSION: Nasogastric tube appears well positioned in the stomach. Electronically Signed   By: Bary Richard M.D.   On: 09/30/2020 09:05   DG Chest Port 1 View  Result Date: 09/20/2020 CLINICAL DATA:  Respiratory failure. EXAM: PORTABLE CHEST 1 VIEW COMPARISON:  No prior. FINDINGS: Tracheostomy tube in good anatomic position. Feeding tube with tip below left hemidiaphragm. Prior median sternotomy. Cardiomegaly. No pulmonary venous congestion. Mild bilateral interstitial prominence. Chronic interstitial lung disease could present this fashion. Active interstitial process including interstitial edema and/or pneumonitis cannot be excluded. Tiny bilateral pleural effusions can not be excluded. No free air. Barium noted the colon. IMPRESSION: 1. Tracheostomy tube in good anatomic position. Feeding tube noted with tip below left hemidiaphragm. 2. Prior median sternotomy. Borderline cardiomegaly. No pulmonary venous congestion. 3. Mild bilateral interstitial prominence. Chronic interstitial lung disease could present in this fashion. Active interstitial process including interstitial edema and/or pneumonitis cannot be excluded. Tiny bilateral pleural  effusions cannot be excluded. Electronically Signed   By: Maisie Fus  Register   On: 09/20/2020 05:40   DG Abd Portable 1V  Result Date: 09/19/2020 CLINICAL DATA:  Peg placement EXAM: PORTABLE ABDOMEN - 1 VIEW COMPARISON:  None. FINDINGS: Contrast was injected through the PEG tube which fills the stomach. No evidence of contrast extravasation. Nonobstructive bowel gas pattern. No free air. IMPRESSION: Contrast injected through the PEG tube seen within the stomach. No visible contrast extravasation. Electronically Signed   By: Charlett Nose M.D.   On: 09/19/2020 18:39    Labs:  CBC: Recent Labs    09/23/20 0227 09/25/20 0551 09/27/20 0441 09/29/20 0239  WBC 12.2* 12.0* 9.0 8.8  HGB 8.3* 8.3* 8.4* 7.9*  HCT 26.0* 26.1* 25.2* 24.4*  PLT 379 423* 429* 428*    COAGS: Recent Labs    09/20/20 0535  INR 1.3*    BMP: Recent Labs    09/25/20 0551 09/26/20 0700 09/27/20 0441 09/29/20 0239  NA 140 139 141 142  K 5.0 5.2* 4.3 4.4  CL 107 108 108 107  CO2 26 24 25 25   GLUCOSE 136* 131* 108* 106*  BUN 51* 66* 55* 61*  CALCIUM 9.0 8.6* 8.9 9.2  CREATININE 1.93* 1.89* 1.93* 1.69*  GFRNONAA 28* 29* 28* 33*    LIVER FUNCTION TESTS: Recent Labs    09/20/20 0535 09/27/20  0441 09/29/20 0239  BILITOT 0.3  --   --   AST 39  --   --   ALT 46*  --   --   ALKPHOS 58  --   --   PROT 5.9*  --   --   ALBUMIN 1.9* 1.9* 1.9*    TUMOR MARKERS: No results for input(s): AFPTM, CEA, CA199, CHROMGRNA in the last 8760 hours.  Assessment and Plan:  CVA; dysphagia and malnutrition Scheduled for percutaneous gastric tube placement Order for Omnipaque 75 ml po night before procedure - in place Will check KUB 4/19 am Plan for 4/19 in IR Risks and benefits image guided gastrostomy tube placement was discussed with the patient's sister Misty Lawson via phone including, but not limited to the need for a barium enema during the procedure, bleeding, infection, peritonitis and/or damage to adjacent  structures.  All questions were answered, she is agreeable to proceed. Consent signed and in chart.    Thank you for this interesting consult.  I greatly enjoyed meeting Misty Lawson and look forward to participating in their care.  A copy of this report was sent to the requesting provider on this date.  Electronically Signed: Robet Leu, PA-C 10/01/2020, 9:53 AM   I spent a total of 20 Minutes    in face to face in clinical consultation, greater than 50% of which was counseling/coordinating care for percutaneous gastric tube placement

## 2020-10-01 NOTE — Progress Notes (Signed)
Pulmonary Critical Care Medicine [facility] [place]   PULMONARY CRITICAL CARE SERVICE  PROGRESS NOTE     Misty Lawson  RUE:[id number]  DOB: [birth year]   DOA: 09/19/2020  Referring Physician: Carron Curie, MD  HPI: Misty Lawson is a 70 y.o. female seen for follow up of Acute on Chronic Respiratory Failure.  Patient is afebrile right now resting comfortably without distress  Medications: Reviewed on Rounds  Physical Exam:  Vitals: Temperature is 96.9 pulse 74 respiratory 20 blood pressure is 148/63 saturations 99%  Ventilator Settings off the ventilator on T collar  . General: Comfortable at this time . Eyes: Grossly normal lids, irises & conjunctiva . ENT: grossly tongue is normal . Neck: no obvious mass . Cardiovascular: S1 S2 normal no gallop . Respiratory: No rhonchi no rales are noted at this time . Abdomen: soft . Skin: no rash seen on limited exam . Musculoskeletal: not rigid . Psychiatric:unable to assess . Neurologic: no seizure no involuntary movements         Lab Data:   Basic Metabolic Panel: Recent Labs  Lab 09/25/20 0551 09/26/20 0700 09/27/20 0441 09/29/20 0239  NA 140 139 141 142  K 5.0 5.2* 4.3 4.4  CL 107 108 108 107  CO2 26 24 25 25   GLUCOSE 136* 131* 108* 106*  BUN 51* 66* 55* 61*  CREATININE 1.93* 1.89* 1.93* 1.69*  CALCIUM 9.0 8.6* 8.9 9.2  MG 2.1 2.0 2.2 2.1  PHOS 5.2* 4.7* 5.4* 5.1*    ABG: Recent Labs  Lab 09/27/20 1215  PHART 7.444  PCO2ART 37.1  PO2ART 70.9*  HCO3 25.1  O2SAT 94.0    Liver Function Tests: Recent Labs  Lab 09/27/20 0441 09/29/20 0239  ALBUMIN 1.9* 1.9*   No results for input(s): LIPASE, AMYLASE in the last 168 hours. No results for input(s): AMMONIA in the last 168 hours.  CBC: Recent Labs  Lab 09/25/20 0551 09/27/20 0441 09/29/20 0239  WBC 12.0* 9.0 8.8  HGB 8.3* 8.4* 7.9*  HCT 26.1* 25.2* 24.4*  MCV 92.9 90.0 89.7  PLT 423* 429* 428*    Cardiac Enzymes: No results for  input(s): CKTOTAL, CKMB, CKMBINDEX, TROPONINI in the last 168 hours.  BNP (last 3 results) No results for input(s): BNP in the last 8760 hours.  ProBNP (last 3 results) No results for input(s): PROBNP in the last 8760 hours.  Radiological Exams: DG Abd 1 View  Result Date: 09/30/2020 CLINICAL DATA:  Nasogastric tube placement EXAM: ABDOMEN - 1 VIEW COMPARISON:  None. FINDINGS: Nasogastric tube appears well positioned in the stomach. No dilated large or small bowel loops are seen. No evidence of free intraperitoneal air. Lung bases appear clear. IMPRESSION: Nasogastric tube appears well positioned in the stomach. Electronically Signed   By: 10/02/2020 M.D.   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 Acute on chronic respiratory failure with hypoxia plan is going to be to continue with T collar.  Titrate oxygen continue pulmonary toilet. 2. Polysubstance abuse patient is at baseline 3. Acute stroke no change 4. Thoracic aortic aneurysm stable   I have personally seen and evaluated the patient, evaluated laboratory and imaging results, formulated the assessment and plan and placed orders. The Patient requires high complexity decision making with multiple  systems involvement.  Rounds were done with the Respiratory Therapy Director and Staff therapists and discussed with nursing staff also.  Allyne Gee, MD [facility] Pulmonary Critical Care Medicine Sleep Medicine

## 2020-10-02 ENCOUNTER — Other Ambulatory Visit (HOSPITAL_COMMUNITY): Payer: Medicare Other

## 2020-10-02 DIAGNOSIS — I7101 Dissection of thoracic aorta: Secondary | ICD-10-CM | POA: Diagnosis not present

## 2020-10-02 DIAGNOSIS — I6312 Cerebral infarction due to embolism of basilar artery: Secondary | ICD-10-CM | POA: Diagnosis not present

## 2020-10-02 DIAGNOSIS — J9621 Acute and chronic respiratory failure with hypoxia: Secondary | ICD-10-CM | POA: Diagnosis not present

## 2020-10-02 DIAGNOSIS — F1910 Other psychoactive substance abuse, uncomplicated: Secondary | ICD-10-CM | POA: Diagnosis not present

## 2020-10-02 HISTORY — PX: IR GASTROSTOMY TUBE MOD SED: IMG625

## 2020-10-02 LAB — CBC
HCT: 26.6 % — ABNORMAL LOW (ref 36.0–46.0)
Hemoglobin: 8.7 g/dL — ABNORMAL LOW (ref 12.0–15.0)
MCH: 29.3 pg (ref 26.0–34.0)
MCHC: 32.7 g/dL (ref 30.0–36.0)
MCV: 89.6 fL (ref 80.0–100.0)
Platelets: 405 10*3/uL — ABNORMAL HIGH (ref 150–400)
RBC: 2.97 MIL/uL — ABNORMAL LOW (ref 3.87–5.11)
RDW: 13.7 % (ref 11.5–15.5)
WBC: 8.3 10*3/uL (ref 4.0–10.5)
nRBC: 0 % (ref 0.0–0.2)

## 2020-10-02 LAB — BASIC METABOLIC PANEL
Anion gap: 7 (ref 5–15)
BUN: 50 mg/dL — ABNORMAL HIGH (ref 8–23)
CO2: 27 mmol/L (ref 22–32)
Calcium: 9.4 mg/dL (ref 8.9–10.3)
Chloride: 106 mmol/L (ref 98–111)
Creatinine, Ser: 1.56 mg/dL — ABNORMAL HIGH (ref 0.44–1.00)
GFR, Estimated: 36 mL/min — ABNORMAL LOW (ref >60)
Glucose, Bld: 105 mg/dL — ABNORMAL HIGH (ref 70–99)
Potassium: 3.7 mmol/L (ref 3.5–5.1)
Sodium: 140 mmol/L (ref 135–145)

## 2020-10-02 LAB — MAGNESIUM: Magnesium: 1.8 mg/dL (ref 1.7–2.4)

## 2020-10-02 MED ORDER — SODIUM CHLORIDE 0.9 % IV SOLN
INTRAVENOUS | Status: AC | PRN
  Administered 2020-10-02: 250 mL via INTRAVENOUS

## 2020-10-02 MED ORDER — SODIUM CHLORIDE 0.9 % IV SOLN
INTRAVENOUS | Status: AC | PRN
  Administered 2020-10-02: 2 g via INTRAVENOUS

## 2020-10-02 MED ORDER — STERILE WATER FOR INJECTION IJ SOLN
INTRAMUSCULAR | Status: AC | PRN
  Administered 2020-10-02: 7 mL

## 2020-10-02 MED ORDER — MIDAZOLAM HCL 2 MG/2ML IJ SOLN
INTRAMUSCULAR | Status: AC
  Filled 2020-10-02: qty 2

## 2020-10-02 MED ORDER — LIDOCAINE-EPINEPHRINE 1 %-1:100000 IJ SOLN
INTRAMUSCULAR | Status: AC
  Filled 2020-10-02: qty 1

## 2020-10-02 MED ORDER — STERILE WATER FOR INJECTION IJ SOLN
INTRAMUSCULAR | Status: AC
  Filled 2020-10-02: qty 10

## 2020-10-02 MED ORDER — FENTANYL CITRATE (PF) 100 MCG/2ML IJ SOLN
INTRAMUSCULAR | Status: AC | PRN
  Administered 2020-10-02: 50 ug via INTRAVENOUS

## 2020-10-02 MED ORDER — MIDAZOLAM HCL 2 MG/2ML IJ SOLN
INTRAMUSCULAR | Status: AC | PRN
  Administered 2020-10-02: 1 mg via INTRAVENOUS

## 2020-10-02 MED ORDER — CEFAZOLIN SODIUM-DEXTROSE 2-4 GM/100ML-% IV SOLN
INTRAVENOUS | Status: AC
  Filled 2020-10-02: qty 100

## 2020-10-02 MED ORDER — FENTANYL CITRATE (PF) 100 MCG/2ML IJ SOLN
INTRAMUSCULAR | Status: AC
  Filled 2020-10-02: qty 2

## 2020-10-02 MED ORDER — LIDOCAINE-EPINEPHRINE 1 %-1:100000 IJ SOLN
INTRAMUSCULAR | Status: AC | PRN
  Administered 2020-10-02: 10 mL

## 2020-10-02 MED ORDER — IOHEXOL 300 MG/ML  SOLN
50.0000 mL | Freq: Once | INTRAMUSCULAR | Status: AC | PRN
  Administered 2020-10-02: 25 mL

## 2020-10-02 NOTE — Procedures (Signed)
Interventional Radiology Procedure Note  Procedure: Placement of percutaneous 18 Fr gastrostomy tube  Complications: None  Recommendations: - NPO except for sips and chips remainder of today and overnight - Maintain G-tube to LWS until tomorrow morning  - May advance diet as tolerated and begin using tube tomorrow morning   Marliss Coots, MD Pager: [phone]

## 2020-10-02 NOTE — Progress Notes (Signed)
Pulmonary Critical Care Medicine [facility] [place]   PULMONARY CRITICAL CARE SERVICE  PROGRESS NOTE     PRISCILA BEAN  OZH:[id number]  DOB: [birth year]   DOA: 09/19/2020  Referring Physician: Carron Curie, MD  HPI: Misty Lawson is a 70 y.o. female seen for follow up of Acute on Chronic Respiratory Failure.  Patient is on T collar comfortable right now without distress has been on 35% FiO2  Medications: Reviewed on Rounds  Physical Exam:  Vitals: Temperature 97.5 pulse 65 respiratory rate 16 blood pressure 158/95 saturations 99%  Ventilator Settings on T collar with an FiO2 35%  . General: Comfortable at this time . Eyes: Grossly normal lids, irises & conjunctiva . ENT: grossly tongue is normal . Neck: no obvious mass . Cardiovascular: S1 S2 normal no gallop . Respiratory: No rhonchi no rales are noted at this time . Abdomen: soft . Skin: no rash seen on limited exam . Musculoskeletal: not rigid . Psychiatric:unable to assess . Neurologic: no seizure no involuntary movements         Lab Data:   Basic Metabolic Panel: Recent Labs  Lab 09/26/20 0700 09/27/20 0441 09/29/20 0239 10/02/20 0431  NA 139 141 142 140  K 5.2* 4.3 4.4 3.7  CL 108 108 107 106  CO2 24 25 25 27   GLUCOSE 131* 108* 106* 105*  BUN 66* 55* 61* 50*  CREATININE 1.89* 1.93* 1.69* 1.56*  CALCIUM 8.6* 8.9 9.2 9.4  MG 2.0 2.2 2.1 1.8  PHOS 4.7* 5.4* 5.1*  --     ABG: Recent Labs  Lab 09/27/20 1215  PHART 7.444  PCO2ART 37.1  PO2ART 70.9*  HCO3 25.1  O2SAT 94.0    Liver Function Tests: Recent Labs  Lab 09/27/20 0441 09/29/20 0239  ALBUMIN 1.9* 1.9*   No results for input(s): LIPASE, AMYLASE in the last 168 hours. No results for input(s): AMMONIA in the last 168 hours.  CBC: Recent Labs  Lab 09/27/20 0441 09/29/20 0239 10/02/20 0431  WBC 9.0 8.8 8.3  HGB 8.4* 7.9* 8.7*  HCT 25.2* 24.4* 26.6*  MCV 90.0 89.7 89.6  PLT 429* 428* 405*    Cardiac Enzymes: No  results for input(s): CKTOTAL, CKMB, CKMBINDEX, TROPONINI in the last 168 hours.  BNP (last 3 results) No results for input(s): BNP in the last 8760 hours.  ProBNP (last 3 results) No results for input(s): PROBNP in the last 8760 hours.  Radiological Exams: DG Abd Portable 1V  Result Date: 10/02/2020 CLINICAL DATA:  Contrast administration prior to G-tube placement EXAM: PORTABLE ABDOMEN - 1 VIEW COMPARISON:  CT 09/28/2020, radiograph 09/30/2020 FINDINGS: Small residual right pleural effusion. Cardiomegaly similar to prior. Transesophageal tube tip terminates in the left mid abdomen with side port distal to the GE junction, likely within the gastric lumen. High attenuation enteric contrast media traversed largely through the bowel with some persistent opacification of the transverse colon and splenic flexure and rectosigmoid. No high-grade obstructive bowel gas pattern is seen. No suspicious abdominal calcifications. No other acute osseous or soft tissue abnormality. IMPRESSION: 1. Transesophageal tube tip in the left mid abdomen with side port distal to the GE junction, likely within the gastric lumen. 2. High attenuation enteric contrast media traversed largely through the bowel with some persistent opacification of the colon from the transverse to rectosigmoid. Electronically Signed   By: 10/02/2020 M.D.   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 Acute on chronic respiratory failure hypoxia we will continue with T collar trials patient is on 35% FiO2. 2. Polysubstance abuse no change we will continue to follow along 3. Acute stroke at baseline 4. Thoracic aortic aneurysm no change continue to monitor   I have personally seen and evaluated the patient, evaluated laboratory and imaging  results, formulated the assessment and plan and placed orders. The Patient requires high complexity decision making with multiple systems involvement.  Rounds were done with the Respiratory Therapy Director and Staff therapists and discussed with nursing staff also.  Yevonne Pax, MD [facility] Pulmonary Critical Care Medicine Sleep Medicine

## 2020-10-03 DIAGNOSIS — F1910 Other psychoactive substance abuse, uncomplicated: Secondary | ICD-10-CM | POA: Diagnosis not present

## 2020-10-03 DIAGNOSIS — I7101 Dissection of thoracic aorta: Secondary | ICD-10-CM | POA: Diagnosis not present

## 2020-10-03 DIAGNOSIS — J9621 Acute and chronic respiratory failure with hypoxia: Secondary | ICD-10-CM | POA: Diagnosis not present

## 2020-10-03 DIAGNOSIS — I6312 Cerebral infarction due to embolism of basilar artery: Secondary | ICD-10-CM | POA: Diagnosis not present

## 2020-10-03 NOTE — Progress Notes (Signed)
   G tube placed in IR 4/19 Site is clean and dry NT no bleeding T tacs-- resorbable per Dr Elby Showers Will resorb on own-- without removal  RN aware  May use now Order in chart

## 2020-10-03 NOTE — Progress Notes (Signed)
Pulmonary Critical Care Medicine [facility] [place]   PULMONARY CRITICAL CARE SERVICE  PROGRESS NOTE     Misty Lawson  KKX:[id number]  DOB: 08-11-1950   DOA: 09/19/2020  Referring Physician: Carron Curie, MD  HPI: Misty Lawson is a 70 y.o. female seen for follow up of Acute on Chronic Respiratory Failure.  Patient is on T collar currently on 35% FiO2 using the PMV  Medications: Reviewed on Rounds  Physical Exam:  Vitals: Temperature is 96.9 pulse 61 respiratory rate 20 blood pressure is 176/91 saturations 98%  Ventilator Settings on T collar with an FiO2 of 35% using PMV  . General: Comfortable at this time . Eyes: Grossly normal lids, irises & conjunctiva . ENT: grossly tongue is normal . Neck: no obvious mass . Cardiovascular: S1 S2 normal no gallop . Respiratory: No rhonchi or rales are noted at this time . Abdomen: soft . Skin: no rash seen on limited exam . Musculoskeletal: not rigid . Psychiatric:unable to assess . Neurologic: no seizure no involuntary movements         Lab Data:   Basic Metabolic Panel: Recent Labs  Lab 09/27/20 0441 09/29/20 0239 10/02/20 0431  NA 141 142 140  K 4.3 4.4 3.7  CL 108 107 106  CO2 25 25 27   GLUCOSE 108* 106* 105*  BUN 55* 61* 50*  CREATININE 1.93* 1.69* 1.56*  CALCIUM 8.9 9.2 9.4  MG 2.2 2.1 1.8  PHOS 5.4* 5.1*  --     ABG: Recent Labs  Lab 09/27/20 1215  PHART 7.444  PCO2ART 37.1  PO2ART 70.9*  HCO3 25.1  O2SAT 94.0    Liver Function Tests: Recent Labs  Lab 09/27/20 0441 09/29/20 0239  ALBUMIN 1.9* 1.9*   No results for input(s): LIPASE, AMYLASE in the last 168 hours. No results for input(s): AMMONIA in the last 168 hours.  CBC: Recent Labs  Lab 09/27/20 0441 09/29/20 0239 10/02/20 0431  WBC 9.0 8.8 8.3  HGB 8.4* 7.9* 8.7*  HCT 25.2* 24.4* 26.6*  MCV 90.0 89.7 89.6  PLT 429* 428* 405*    Cardiac Enzymes: No results for input(s): CKTOTAL, CKMB, CKMBINDEX, TROPONINI in the  last 168 hours.  BNP (last 3 results) No results for input(s): BNP in the last 8760 hours.  ProBNP (last 3 results) No results for input(s): PROBNP in the last 8760 hours.  Radiological Exams: IR GASTROSTOMY TUBE MOD SED  Result Date: 10/02/2020 INDICATION: 70 year old female with history of stroke and dysphagia requiring percutaneous enteric access for nutritional supplementation. EXAM: PERC PLACEMENT GASTROSTOMY MEDICATIONS: Ancef 2 gm IV; Antibiotics were administered within 1 hour of the procedure. ANESTHESIA/SEDATION: Versed 1 mg IV; Fentanyl 50 mcg IV Moderate Sedation Time:  15 The patient was continuously monitored during the procedure by the interventional radiology nurse under my direct supervision. CONTRAST:  15mL OMNIPAQUE IOHEXOL 300 MG/ML SOLN - administered into the gastric lumen. FLUOROSCOPY TIME:  Fluoroscopy Time: 0 minutes 48 seconds (3 mGy). COMPLICATIONS: None immediate. PROCEDURE: Informed written consent was obtained from the patient after a thorough discussion of the procedural risks, benefits and alternatives. All questions were addressed. Maximal Sterile Barrier Technique was utilized including caps, mask, sterile gowns, sterile gloves, sterile drape, hand hygiene and skin antiseptic. A timeout was performed prior to the initiation of the procedure. The patient was placed on the procedure table in the supine position. Pre-procedure abdominal film confirmed visualization of the transverse colon. The patient was prepped and draped in usual sterile fashion. The  stomach was insufflated with air via the indwelling nasogastric tube. Under fluoroscopy, a puncture site was selected and local analgesia achieved with 1% lidocaine with epinephrine infiltrated subcutaneously. Under fluoroscopic guidance, a gastropexy needle was passed into the stomach and the T-bar suture was released. Entry into the stomach was confirmed with fluoroscopy, aspiration of air, and injection of contrast  material. This was repeated with an additional gastropexy suture (for a total of 2 fasteners). At the center of these gastropexy sutures, a dermatotomy was performed. An 18 gauge needle was passed into the stomach at the site of this dermatotomy, and position within the gastric lumen again confirmed under fluoroscopy using aspiration of air and contrast injection. An Amplatz guidewire was passed through this needle and intraluminal placement within the stomach was confirmed by fluoroscopy. The needle was removed. Over the guidewire, the percutaneous tract was dilated using a 8 mm mm non-compliant balloon. The balloon was deflated, then pushed into the gastric lumen followed in concert by the 18 French gastrostomy tube. The retention balloon of the percutaneous gastrostomy tube was inflated with 10 mL of sterile water. The tube was withdrawn until the retention balloon was at the edge of the gastric lumen. The external bumper was brought to the abdominal wall. Contrast was injected through the gastrostomy tube, confirming intraluminal positioning. The patient tolerated the procedure well without any immediate post-procedural complications. IMPRESSION: Technically successful placement of 18 Fr gastrostomy tube with absorbable gastropexy suture anchors. Marliss Coots, MD [facility] Radiology Electronically Signed   By: Marliss Coots MD   On: 10/02/2020 16:40   DG Abd Portable 1V  Result Date: 10/02/2020 CLINICAL DATA:  Contrast administration prior to G-tube placement EXAM: PORTABLE ABDOMEN - 1 VIEW COMPARISON:  CT 09/28/2020, radiograph 09/30/2020 FINDINGS: Small residual right pleural effusion. Cardiomegaly similar to prior. Transesophageal tube tip terminates in the left mid abdomen with side port distal to the GE junction, likely within the gastric lumen. High attenuation enteric contrast media traversed largely through the bowel with some persistent  opacification of the transverse colon and splenic flexure and rectosigmoid. No high-grade obstructive bowel gas pattern is seen. No suspicious abdominal calcifications. No other acute osseous or soft tissue abnormality. IMPRESSION: 1. Transesophageal tube tip in the left mid abdomen with side port distal to the GE junction, likely within the gastric lumen. 2. High attenuation enteric contrast media traversed largely through the bowel with some persistent opacification of the colon from the transverse to rectosigmoid. Electronically Signed   By: Kreg Shropshire M.D.   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 Acute on chronic respiratory failure plan is going to be to try capping today if patient is able to tolerate. 2. Polysubstance abuse no change we will continue with supportive care 3. Acute stroke no change therapy as tolerated. 4. Aortic dissection patient is at baseline   I have personally seen and evaluated the patient, evaluated laboratory and imaging results, formulated the assessment and plan and placed orders. The Patient requires high complexity decision making with multiple systems involvement.  Rounds were done with the Respiratory Therapy Director and Staff therapists and discussed with nursing staff also.  Yevonne Pax, MD [facility] Pulmonary Critical Care Medicine Sleep Medicine

## 2020-10-04 DIAGNOSIS — F1910 Other psychoactive substance abuse, uncomplicated: Secondary | ICD-10-CM | POA: Diagnosis not present

## 2020-10-04 DIAGNOSIS — I6312 Cerebral infarction due to embolism of basilar artery: Secondary | ICD-10-CM | POA: Diagnosis not present

## 2020-10-04 DIAGNOSIS — J9621 Acute and chronic respiratory failure with hypoxia: Secondary | ICD-10-CM | POA: Diagnosis not present

## 2020-10-04 DIAGNOSIS — I7101 Dissection of thoracic aorta: Secondary | ICD-10-CM | POA: Diagnosis not present

## 2020-10-04 NOTE — Progress Notes (Signed)
Pulmonary Critical Care Medicine [facility] [place]   PULMONARY CRITICAL CARE SERVICE  PROGRESS NOTE     Misty Lawson  WGN:[id number]  DOB: 02-04-1951   DOA: 09/19/2020  Referring Physician: Carron Curie, MD  HPI: Misty Lawson is a 70 y.o. female seen for follow up of Acute on Chronic Respiratory Failure.  Patient is capping today will be completing 48 hours.  Medications: Reviewed on Rounds  Physical Exam:  Vitals: Temperature is 97.2 pulse 75 respiratory rate is 24 blood pressure is 149/96 saturations 98%  Ventilator Settings capping off the ventilator the goal will be for 48 hours  . General: Comfortable at this time . Eyes: Grossly normal lids, irises & conjunctiva . ENT: grossly tongue is normal . Neck: no obvious mass . Cardiovascular: S1 S2 normal no gallop . Respiratory: No rhonchi or rales are noted at this time . Abdomen: soft . Skin: no rash seen on limited exam . Musculoskeletal: not rigid . Psychiatric:unable to assess . Neurologic: no seizure no involuntary movements         Lab Data:   Basic Metabolic Panel: Recent Labs  Lab 09/29/20 0239 10/02/20 0431  NA 142 140  K 4.4 3.7  CL 107 106  CO2 25 27  GLUCOSE 106* 105*  BUN 61* 50*  CREATININE 1.69* 1.56*  CALCIUM 9.2 9.4  MG 2.1 1.8  PHOS 5.1*  --     ABG: No results for input(s): PHART, PCO2ART, PO2ART, HCO3, O2SAT in the last 168 hours.  Liver Function Tests: Recent Labs  Lab 09/29/20 0239  ALBUMIN 1.9*   No results for input(s): LIPASE, AMYLASE in the last 168 hours. No results for input(s): AMMONIA in the last 168 hours.  CBC: Recent Labs  Lab 09/29/20 0239 10/02/20 0431  WBC 8.8 8.3  HGB 7.9* 8.7*  HCT 24.4* 26.6*  MCV 89.7 89.6  PLT 428* 405*    Cardiac Enzymes: No results for input(s): CKTOTAL, CKMB, CKMBINDEX, TROPONINI in the last 168 hours.  BNP (last 3 results) No results for input(s): BNP in the last 8760 hours.  ProBNP (last 3 results) No  results for input(s): PROBNP in the last 8760 hours.  Radiological Exams: IR GASTROSTOMY TUBE MOD SED  Result Date: 10/02/2020 INDICATION: 70 year old female with history of stroke and dysphagia requiring percutaneous enteric access for nutritional supplementation. EXAM: PERC PLACEMENT GASTROSTOMY MEDICATIONS: Ancef 2 gm IV; Antibiotics were administered within 1 hour of the procedure. ANESTHESIA/SEDATION: Versed 1 mg IV; Fentanyl 50 mcg IV Moderate Sedation Time:  15 The patient was continuously monitored during the procedure by the interventional radiology nurse under my direct supervision. CONTRAST:  46mL OMNIPAQUE IOHEXOL 300 MG/ML SOLN - administered into the gastric lumen. FLUOROSCOPY TIME:  Fluoroscopy Time: 0 minutes 48 seconds (3 mGy). COMPLICATIONS: None immediate. PROCEDURE: Informed written consent was obtained from the patient after a thorough discussion of the procedural risks, benefits and alternatives. All questions were addressed. Maximal Sterile Barrier Technique was utilized including caps, mask, sterile gowns, sterile gloves, sterile drape, hand hygiene and skin antiseptic. A timeout was performed prior to the initiation of the procedure. The patient was placed on the procedure table in the supine position. Pre-procedure abdominal film confirmed visualization of the transverse colon. The patient was prepped and draped in usual sterile fashion. The stomach was insufflated with air via the indwelling nasogastric tube. Under fluoroscopy, a puncture site was selected and local analgesia achieved with 1% lidocaine with epinephrine infiltrated subcutaneously. Under fluoroscopic guidance, a  gastropexy needle was passed into the stomach and the T-bar suture was released. Entry into the stomach was confirmed with fluoroscopy, aspiration of air, and injection of contrast material. This was repeated with an additional gastropexy suture (for a total of 2 fasteners). At the center of these gastropexy  sutures, a dermatotomy was performed. An 18 gauge needle was passed into the stomach at the site of this dermatotomy, and position within the gastric lumen again confirmed under fluoroscopy using aspiration of air and contrast injection. An Amplatz guidewire was passed through this needle and intraluminal placement within the stomach was confirmed by fluoroscopy. The needle was removed. Over the guidewire, the percutaneous tract was dilated using a 8 mm mm non-compliant balloon. The balloon was deflated, then pushed into the gastric lumen followed in concert by the 18 French gastrostomy tube. The retention balloon of the percutaneous gastrostomy tube was inflated with 10 mL of sterile water. The tube was withdrawn until the retention balloon was at the edge of the gastric lumen. The external bumper was brought to the abdominal wall. Contrast was injected through the gastrostomy tube, confirming intraluminal positioning. The patient tolerated the procedure well without any immediate post-procedural complications. IMPRESSION: Technically successful placement of 18 Fr gastrostomy tube with absorbable gastropexy suture anchors. 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 Acute on chronic respiratory failure with hypoxia we will continue with capping as ordered continue secretion management supportive care 2. Polysubstance abuse no change we will continue to monitor. 3. Acute stroke patient is at baseline 4. Acute thoracic aortic dissection no change we will continue to follow along   I have personally seen and evaluated the patient, evaluated laboratory and imaging results,  formulated the assessment and plan and placed orders. The Patient requires high complexity decision making with multiple systems involvement.  Rounds were done with the Respiratory Therapy Director and Staff therapists and discussed with nursing staff also.  Yevonne Pax, MD [facility] Pulmonary Critical Care Medicine Sleep Medicine

## 2020-10-05 DIAGNOSIS — J9621 Acute and chronic respiratory failure with hypoxia: Secondary | ICD-10-CM | POA: Diagnosis not present

## 2020-10-05 DIAGNOSIS — I7101 Dissection of thoracic aorta: Secondary | ICD-10-CM | POA: Diagnosis not present

## 2020-10-05 DIAGNOSIS — F1910 Other psychoactive substance abuse, uncomplicated: Secondary | ICD-10-CM | POA: Diagnosis not present

## 2020-10-05 DIAGNOSIS — I6312 Cerebral infarction due to embolism of basilar artery: Secondary | ICD-10-CM | POA: Diagnosis not present

## 2020-10-05 NOTE — Progress Notes (Signed)
Pulmonary Critical Care Medicine [facility] [place]   PULMONARY CRITICAL CARE SERVICE  PROGRESS NOTE     Misty Lawson  FYB:[id number]  DOB: 06/22/1950   DOA: 09/19/2020  Referring Physician: Carron Curie, MD  HPI: Misty Lawson is a 71 y.o. female seen for follow up of Acute on Chronic Respiratory Failure.  Patient is capping and appears to be doing well.  Right now is comfortable without distress  Medications: Reviewed on Rounds  Physical Exam:  Vitals: Temperature is 96.5 pulse 63 respiratory rate is 18 blood pressure is 131/82 saturations 99%  Ventilator Settings capping off the vent right now  . General: Comfortable at this time . Eyes: Grossly normal lids, irises & conjunctiva . ENT: grossly tongue is normal . Neck: no obvious mass . Cardiovascular: S1 S2 normal no gallop . Respiratory: No rhonchi no rales are noted at this time . Abdomen: soft . Skin: no rash seen on limited exam . Musculoskeletal: not rigid . Psychiatric:unable to assess . Neurologic: no seizure no involuntary movements         Lab Data:   Basic Metabolic Panel: Recent Labs  Lab 09/29/20 0239 10/02/20 0431  NA 142 140  K 4.4 3.7  CL 107 106  CO2 25 27  GLUCOSE 106* 105*  BUN 61* 50*  CREATININE 1.69* 1.56*  CALCIUM 9.2 9.4  MG 2.1 1.8  PHOS 5.1*  --     ABG: No results for input(s): PHART, PCO2ART, PO2ART, HCO3, O2SAT in the last 168 hours.  Liver Function Tests: Recent Labs  Lab 09/29/20 0239  ALBUMIN 1.9*   No results for input(s): LIPASE, AMYLASE in the last 168 hours. No results for input(s): AMMONIA in the last 168 hours.  CBC: Recent Labs  Lab 09/29/20 0239 10/02/20 0431  WBC 8.8 8.3  HGB 7.9* 8.7*  HCT 24.4* 26.6*  MCV 89.7 89.6  PLT 428* 405*    Cardiac Enzymes: No results for input(s): CKTOTAL, CKMB, CKMBINDEX, TROPONINI in the last 168 hours.  BNP (last 3 results) No results for input(s): BNP in the last 8760 hours.  ProBNP (last 3  results) No results for input(s): PROBNP in the last 8760 hours.  Radiological Exams: No results found.  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 Acute on chronic respiratory failure with hypoxia we will continue with the capping trials as ordered.  Continue secretion management supportive care. 2. Polysubstance abuse no change we will continue to monitor. 3. Acute stroke patient is at baseline 4. Acute aortic dissection supportive care at this time   I have personally seen and evaluated the patient, evaluated laboratory and imaging results, formulated the assessment and plan and placed orders. The Patient requires high complexity decision making with multiple systems involvement.  Rounds were done with the Respiratory Therapy Director and Staff therapists and discussed with nursing staff also.  Yevonne Pax, MD [facility] Pulmonary Critical Care Medicine Sleep Medicine

## 2020-10-06 DIAGNOSIS — F1910 Other psychoactive substance abuse, uncomplicated: Secondary | ICD-10-CM | POA: Diagnosis not present

## 2020-10-06 DIAGNOSIS — J9621 Acute and chronic respiratory failure with hypoxia: Secondary | ICD-10-CM | POA: Diagnosis not present

## 2020-10-06 DIAGNOSIS — I7101 Dissection of thoracic aorta: Secondary | ICD-10-CM | POA: Diagnosis not present

## 2020-10-06 DIAGNOSIS — I6312 Cerebral infarction due to embolism of basilar artery: Secondary | ICD-10-CM | POA: Diagnosis not present

## 2020-10-06 LAB — CBC
HCT: 27.9 % — ABNORMAL LOW (ref 36.0–46.0)
Hemoglobin: 9 g/dL — ABNORMAL LOW (ref 12.0–15.0)
MCH: 29.1 pg (ref 26.0–34.0)
MCHC: 32.3 g/dL (ref 30.0–36.0)
MCV: 90.3 fL (ref 80.0–100.0)
Platelets: 379 10*3/uL (ref 150–400)
RBC: 3.09 MIL/uL — ABNORMAL LOW (ref 3.87–5.11)
RDW: 14.1 % (ref 11.5–15.5)
WBC: 8.4 10*3/uL (ref 4.0–10.5)
nRBC: 0 % (ref 0.0–0.2)

## 2020-10-06 LAB — BASIC METABOLIC PANEL
Anion gap: 7 (ref 5–15)
BUN: 75 mg/dL — ABNORMAL HIGH (ref 8–23)
CO2: 28 mmol/L (ref 22–32)
Calcium: 9.8 mg/dL (ref 8.9–10.3)
Chloride: 101 mmol/L (ref 98–111)
Creatinine, Ser: 1.54 mg/dL — ABNORMAL HIGH (ref 0.44–1.00)
GFR, Estimated: 37 mL/min — ABNORMAL LOW (ref >60)
Glucose, Bld: 107 mg/dL — ABNORMAL HIGH (ref 70–99)
Potassium: 3.9 mmol/L (ref 3.5–5.1)
Sodium: 136 mmol/L (ref 135–145)

## 2020-10-06 LAB — MAGNESIUM: Magnesium: 1.9 mg/dL (ref 1.7–2.4)

## 2020-10-06 NOTE — Progress Notes (Signed)
Pulmonary Critical Care Medicine [facility] [place]   PULMONARY CRITICAL CARE SERVICE  PROGRESS NOTE     Misty Lawson  [id number]  DOB: 06/26/1950   DOA: 09/19/2020  Referring Physician: Carron Curie, MD  HPI: Misty Lawson is a 70 y.o. female seen for follow up of Acute on Chronic Respiratory Failure.  Patient is capping currently on 2 L she is doing well  Medications: Reviewed on Rounds  Physical Exam:  Vitals: Temperature is 97.0 pulse 71 respiratory 22 blood pressure 154/93 saturations 98%  Ventilator Settings capping on 2 L O2  . General: Comfortable at this time . Eyes: Grossly normal lids, irises & conjunctiva . ENT: grossly tongue is normal . Neck: no obvious mass . Cardiovascular: S1 S2 normal no gallop . Respiratory: No rhonchi coarse breath sounds . Abdomen: soft . Skin: no rash seen on limited exam . Musculoskeletal: not rigid . Psychiatric:unable to assess . Neurologic: no seizure no involuntary movements         Lab Data:   Basic Metabolic Panel: Recent Labs  Lab 10/02/20 0431 10/06/20 0422  NA 140 136  K 3.7 3.9  CL 106 101  CO2 27 28  GLUCOSE 105* 107*  BUN 50* 75*  CREATININE 1.56* 1.54*  CALCIUM 9.4 9.8  MG 1.8 1.9    ABG: No results for input(s): PHART, PCO2ART, PO2ART, HCO3, O2SAT in the last 168 hours.  Liver Function Tests: No results for input(s): AST, ALT, ALKPHOS, BILITOT, PROT, ALBUMIN in the last 168 hours. No results for input(s): LIPASE, AMYLASE in the last 168 hours. No results for input(s): AMMONIA in the last 168 hours.  CBC: Recent Labs  Lab 10/02/20 0431 10/06/20 0422  WBC 8.3 8.4  HGB 8.7* 9.0*  HCT 26.6* 27.9*  MCV 89.6 90.3  PLT 405* 379    Cardiac Enzymes: No results for input(s): CKTOTAL, CKMB, CKMBINDEX, TROPONINI in the last 168 hours.  BNP (last 3 results) No results for input(s): BNP in the last 8760 hours.  ProBNP (last 3 results) No results for input(s): PROBNP in the last  8760 hours.  Radiological Exams: No results found.  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 Acute on chronic respiratory failure with hypoxia patient continues to do well with capping trials advance as tolerated continue secretion management supportive care 2. Polysubstance abuse she is at her baseline. 3. Acute stroke no change therapy as tolerated. 4. Aortic dissection no change we will continue with supportive care   I have personally seen and evaluated the patient, evaluated laboratory and imaging results, formulated the assessment and plan and placed orders. The Patient requires high complexity decision making with multiple systems involvement.  Rounds were done with the Respiratory Therapy Director and Staff therapists and discussed with nursing staff also.  Yevonne Pax, MD [facility] Pulmonary Critical Care Medicine Sleep Medicine

## 2020-10-08 DIAGNOSIS — F1910 Other psychoactive substance abuse, uncomplicated: Secondary | ICD-10-CM | POA: Diagnosis not present

## 2020-10-08 DIAGNOSIS — I6312 Cerebral infarction due to embolism of basilar artery: Secondary | ICD-10-CM | POA: Diagnosis not present

## 2020-10-08 DIAGNOSIS — J9621 Acute and chronic respiratory failure with hypoxia: Secondary | ICD-10-CM | POA: Diagnosis not present

## 2020-10-08 DIAGNOSIS — I7101 Dissection of thoracic aorta: Secondary | ICD-10-CM | POA: Diagnosis not present

## 2020-10-08 NOTE — Progress Notes (Signed)
Pulmonary Critical Care Medicine [facility] [place]   PULMONARY CRITICAL CARE SERVICE  PROGRESS NOTE     Misty Lawson  GMW:[id number]  DOB: 1951-01-29   DOA: 09/19/2020  Referring Physician: Carron Curie, MD  HPI: Misty Lawson is a 70 y.o. female seen for follow up of Acute on Chronic Respiratory Failure.  Patient is capping ready for decannulation today  Medications: Reviewed on Rounds  Physical Exam:  Vitals: Temperature is 97.0 pulse 63 respiratory rate 17 blood pressure 109/99 and saturations 97  Ventilator Settings capping off the vent  . General: Comfortable at this time . Eyes: Grossly normal lids, irises & conjunctiva . ENT: grossly tongue is normal . Neck: no obvious mass . Cardiovascular: S1 S2 normal no gallop . Respiratory: No rhonchi no rales are noted at this time . Abdomen: soft . Skin: no rash seen on limited exam . Musculoskeletal: not rigid . Psychiatric:unable to assess . Neurologic: no seizure no involuntary movements         Lab Data:   Basic Metabolic Panel: Recent Labs  Lab 10/02/20 0431 10/06/20 0422  NA 140 136  K 3.7 3.9  CL 106 101  CO2 27 28  GLUCOSE 105* 107*  BUN 50* 75*  CREATININE 1.56* 1.54*  CALCIUM 9.4 9.8  MG 1.8 1.9    ABG: No results for input(s): PHART, PCO2ART, PO2ART, HCO3, O2SAT in the last 168 hours.  Liver Function Tests: No results for input(s): AST, ALT, ALKPHOS, BILITOT, PROT, ALBUMIN in the last 168 hours. No results for input(s): LIPASE, AMYLASE in the last 168 hours. No results for input(s): AMMONIA in the last 168 hours.  CBC: Recent Labs  Lab 10/02/20 0431 10/06/20 0422  WBC 8.3 8.4  HGB 8.7* 9.0*  HCT 26.6* 27.9*  MCV 89.6 90.3  PLT 405* 379    Cardiac Enzymes: No results for input(s): CKTOTAL, CKMB, CKMBINDEX, TROPONINI in the last 168 hours.  BNP (last 3 results) No results for input(s): BNP in the last 8760 hours.  ProBNP (last 3 results) No results for input(s):  PROBNP in the last 8760 hours.  Radiological Exams: No results found.  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 Acute on chronic respiratory failure with hypoxia we will proceed to decannulate today patient has done well with the weaning trials. 2. Polysubstance abuse no change we will continue to follow along 3. Acute stroke patient is at baseline now therapy as tolerated. 4. Thoracic aortic dissection supportive care no dissection pain is noted at this time   I have personally seen and evaluated the patient, evaluated laboratory and imaging results, formulated the assessment and plan and placed orders. The Patient requires high complexity decision making with multiple systems involvement.  Rounds were done with the Respiratory Therapy Director and Staff therapists and discussed with nursing staff also.  Yevonne Pax, MD [facility] Pulmonary Critical Care Medicine Sleep Medicine

## 2020-10-09 LAB — CBC
HCT: 26.7 % — ABNORMAL LOW (ref 36.0–46.0)
Hemoglobin: 8.8 g/dL — ABNORMAL LOW (ref 12.0–15.0)
MCH: 29.4 pg (ref 26.0–34.0)
MCHC: 33 g/dL (ref 30.0–36.0)
MCV: 89.3 fL (ref 80.0–100.0)
Platelets: 327 10*3/uL (ref 150–400)
RBC: 2.99 MIL/uL — ABNORMAL LOW (ref 3.87–5.11)
RDW: 14 % (ref 11.5–15.5)
WBC: 6.1 10*3/uL (ref 4.0–10.5)
nRBC: 0 % (ref 0.0–0.2)

## 2020-10-09 LAB — RENAL FUNCTION PANEL
Albumin: 2.6 g/dL — ABNORMAL LOW (ref 3.5–5.0)
Anion gap: 10 (ref 5–15)
BUN: 63 mg/dL — ABNORMAL HIGH (ref 8–23)
CO2: 26 mmol/L (ref 22–32)
Calcium: 9.9 mg/dL (ref 8.9–10.3)
Chloride: 100 mmol/L (ref 98–111)
Creatinine, Ser: 1.65 mg/dL — ABNORMAL HIGH (ref 0.44–1.00)
GFR, Estimated: 34 mL/min — ABNORMAL LOW (ref >60)
Glucose, Bld: 105 mg/dL — ABNORMAL HIGH (ref 70–99)
Phosphorus: 4 mg/dL (ref 2.5–4.6)
Potassium: 3.6 mmol/L (ref 3.5–5.1)
Sodium: 136 mmol/L (ref 135–145)

## 2020-10-09 LAB — MAGNESIUM: Magnesium: 2.2 mg/dL (ref 1.7–2.4)

## 2020-10-12 LAB — CBC
HCT: 30.2 % — ABNORMAL LOW (ref 36.0–46.0)
Hemoglobin: 9.6 g/dL — ABNORMAL LOW (ref 12.0–15.0)
MCH: 29.1 pg (ref 26.0–34.0)
MCHC: 31.8 g/dL (ref 30.0–36.0)
MCV: 91.5 fL (ref 80.0–100.0)
Platelets: 363 10*3/uL (ref 150–400)
RBC: 3.3 MIL/uL — ABNORMAL LOW (ref 3.87–5.11)
RDW: 14.5 % (ref 11.5–15.5)
WBC: 7.2 10*3/uL (ref 4.0–10.5)
nRBC: 0 % (ref 0.0–0.2)

## 2020-10-12 LAB — RENAL FUNCTION PANEL
Albumin: 3.1 g/dL — ABNORMAL LOW (ref 3.5–5.0)
Anion gap: 10 (ref 5–15)
BUN: 95 mg/dL — ABNORMAL HIGH (ref 8–23)
CO2: 25 mmol/L (ref 22–32)
Calcium: 10.3 mg/dL (ref 8.9–10.3)
Chloride: 109 mmol/L (ref 98–111)
Creatinine, Ser: 1.82 mg/dL — ABNORMAL HIGH (ref 0.44–1.00)
GFR, Estimated: 30 mL/min — ABNORMAL LOW (ref >60)
Glucose, Bld: 127 mg/dL — ABNORMAL HIGH (ref 70–99)
Phosphorus: 4 mg/dL (ref 2.5–4.6)
Potassium: 4.1 mmol/L (ref 3.5–5.1)
Sodium: 144 mmol/L (ref 135–145)

## 2020-10-12 LAB — MAGNESIUM: Magnesium: 2.2 mg/dL (ref 1.7–2.4)

## 2020-10-12 LAB — SARS CORONAVIRUS 2 (TAT 6-24 HRS): SARS Coronavirus 2: NEGATIVE

## 2020-10-15 LAB — BASIC METABOLIC PANEL
Anion gap: 11 (ref 5–15)
BUN: 140 mg/dL — ABNORMAL HIGH (ref 8–23)
CO2: 26 mmol/L (ref 22–32)
Calcium: 11.2 mg/dL — ABNORMAL HIGH (ref 8.9–10.3)
Chloride: 113 mmol/L — ABNORMAL HIGH (ref 98–111)
Creatinine, Ser: 2.02 mg/dL — ABNORMAL HIGH (ref 0.44–1.00)
GFR, Estimated: 27 mL/min — ABNORMAL LOW (ref >60)
Glucose, Bld: 101 mg/dL — ABNORMAL HIGH (ref 70–99)
Potassium: 3.7 mmol/L (ref 3.5–5.1)
Sodium: 150 mmol/L — ABNORMAL HIGH (ref 135–145)

## 2020-10-15 LAB — CBC
HCT: 31.6 % — ABNORMAL LOW (ref 36.0–46.0)
Hemoglobin: 10.1 g/dL — ABNORMAL LOW (ref 12.0–15.0)
MCH: 29.3 pg (ref 26.0–34.0)
MCHC: 32 g/dL (ref 30.0–36.0)
MCV: 91.6 fL (ref 80.0–100.0)
Platelets: 312 10*3/uL (ref 150–400)
RBC: 3.45 MIL/uL — ABNORMAL LOW (ref 3.87–5.11)
RDW: 15 % (ref 11.5–15.5)
WBC: 10.5 10*3/uL (ref 4.0–10.5)
nRBC: 0 % (ref 0.0–0.2)

## 2020-10-15 LAB — SARS CORONAVIRUS 2 (TAT 6-24 HRS): SARS Coronavirus 2: NEGATIVE

## 2020-10-15 LAB — MAGNESIUM: Magnesium: 2.5 mg/dL — ABNORMAL HIGH (ref 1.7–2.4)

## 2022-08-18 IMAGING — DX DG CHEST 1V PORT
1 series · 1 of 1 positions shown · non-contrast
Comparison: No prior.

CLINICAL DATA: Respiratory failure.

EXAM:
PORTABLE CHEST 1 VIEW

[chest ap]
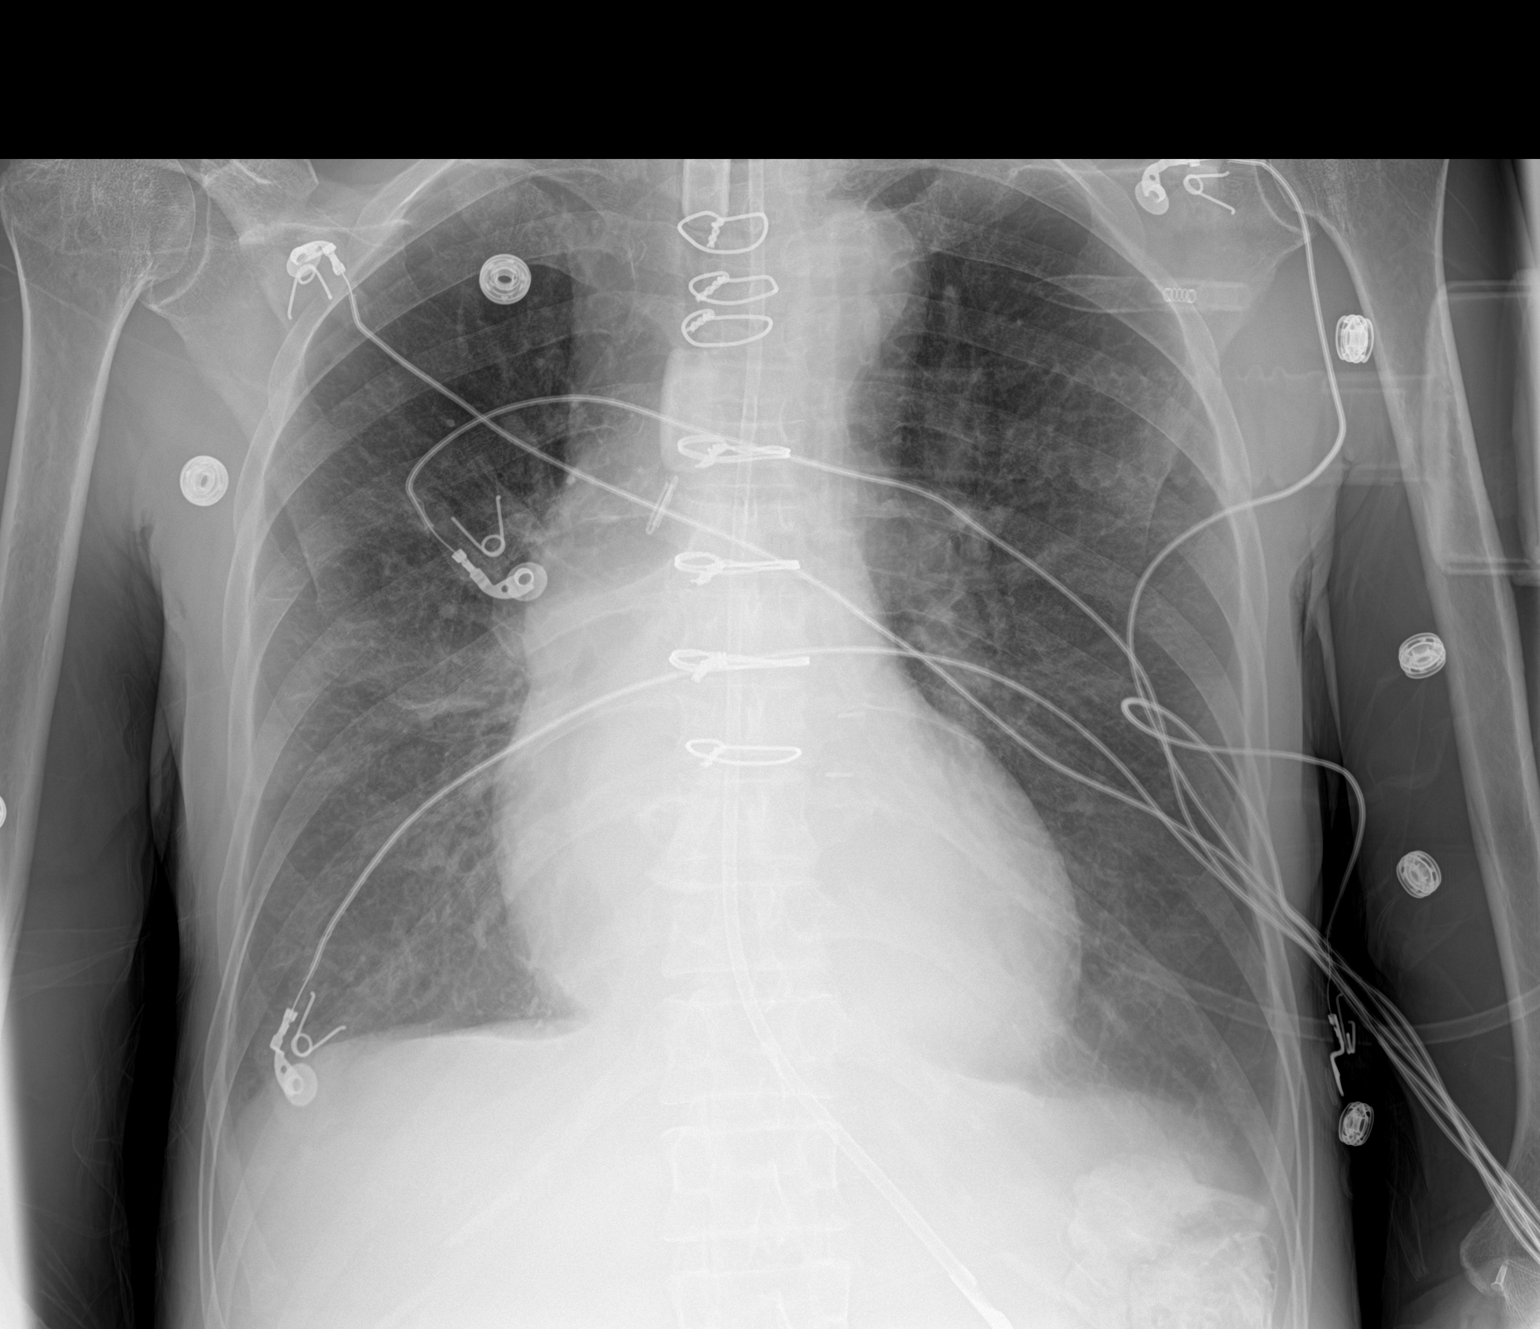

[1 of 1 positions shown; findings below may reference images not displayed]

FINDINGS: Tracheostomy tube in good anatomic position. Feeding tube with tip
below left hemidiaphragm. Prior median sternotomy. Cardiomegaly. No
pulmonary venous congestion. Mild bilateral interstitial prominence.
Chronic interstitial lung disease could present this fashion. Active
interstitial process including interstitial edema and/or pneumonitis
cannot be excluded. Tiny bilateral pleural effusions can not be
excluded. No free air. Barium noted the colon.
IMPRESSION: 1. Tracheostomy tube in good anatomic position. Feeding tube noted
with tip below left hemidiaphragm.

2. Prior median sternotomy. Borderline cardiomegaly. No pulmonary
venous congestion.

3. Mild bilateral interstitial prominence. Chronic interstitial lung
disease could present in this fashion. Active interstitial process
including interstitial edema and/or pneumonitis cannot be excluded.
Tiny bilateral pleural effusions cannot be excluded.

## 2022-08-30 IMAGING — DX DG ABD PORTABLE 1V
1 series · 1 of 1 positions shown · non-contrast
Comparison: CT 09/28/2020, radiograph 09/30/2020

CLINICAL DATA: Contrast administration prior to G-tube placement

EXAM:
PORTABLE ABDOMEN - 1 VIEW

[abdomen]
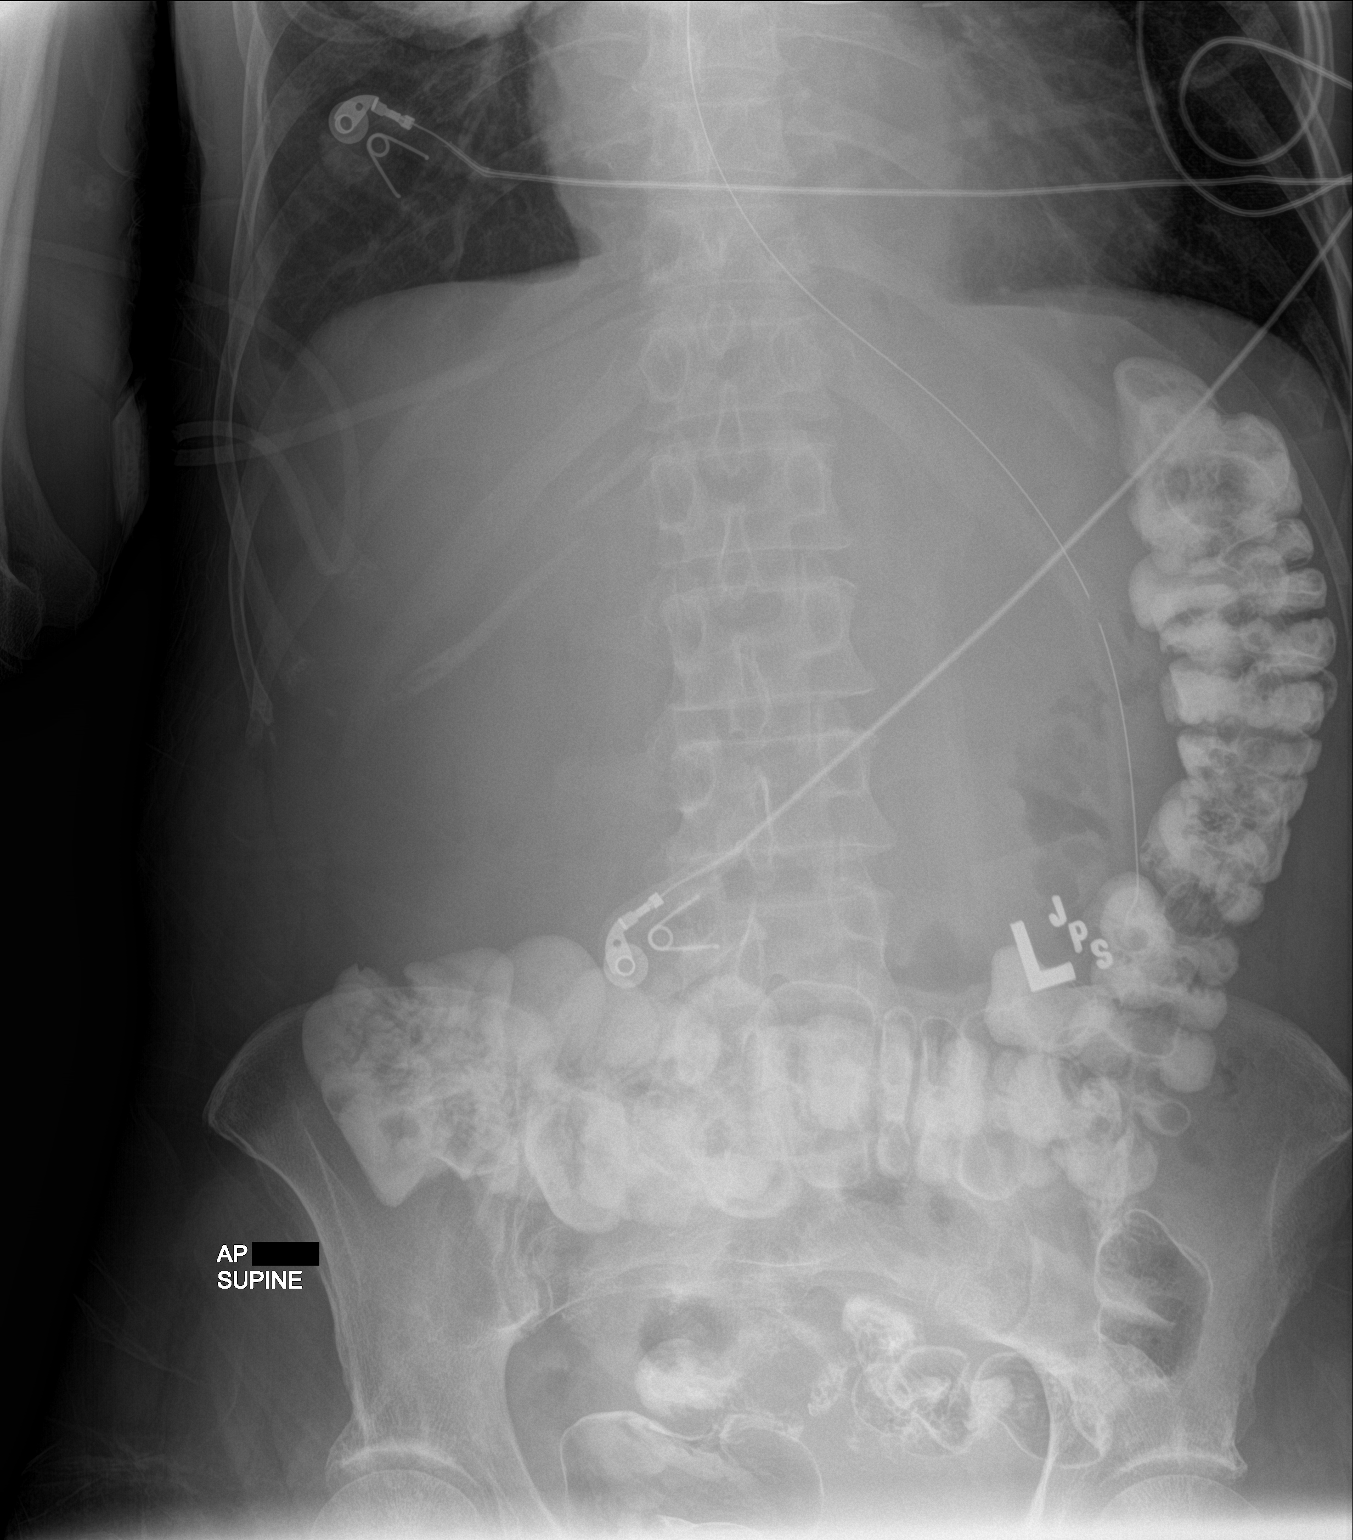

[1 of 1 positions shown; findings below may reference images not displayed]

FINDINGS: Small residual right pleural effusion. Cardiomegaly similar to
prior.

Transesophageal tube tip terminates in the left mid abdomen with
side port distal to the GE junction, likely within the gastric
lumen. High attenuation enteric contrast media traversed largely
through the bowel with some persistent opacification of the
transverse colon and splenic flexure and rectosigmoid. No high-grade
obstructive bowel gas pattern is seen. No suspicious abdominal
calcifications. No other acute osseous or soft tissue abnormality.
IMPRESSION: 1. Transesophageal tube tip in the left mid abdomen with side port
distal to the GE junction, likely within the gastric lumen.
2. High attenuation enteric contrast media traversed largely through
the bowel with some persistent opacification of the colon from the
transverse to rectosigmoid.
